# Patient Record
Sex: Male | Born: 1940 | Race: Black or African American | Hispanic: No | Marital: Married | Smoking: Never smoker
Health system: Southern US, Community
[De-identification: ages and names within clinical notes are randomized; demographics above are authoritative.]

## PROBLEM LIST (undated history)

## (undated) DIAGNOSIS — K219 Gastro-esophageal reflux disease without esophagitis: Secondary | ICD-10-CM

## (undated) DIAGNOSIS — M1990 Unspecified osteoarthritis, unspecified site: Secondary | ICD-10-CM

## (undated) DIAGNOSIS — E785 Hyperlipidemia, unspecified: Secondary | ICD-10-CM

## (undated) HISTORY — DX: Hyperlipidemia, unspecified: E78.5

## (undated) HISTORY — DX: Gastro-esophageal reflux disease without esophagitis: K21.9

---

## 2006-08-12 HISTORY — PX: GANGLION CYST EXCISION: SHX1691

## 2009-10-12 HISTORY — PX: COLONOSCOPY: SHX174

## 2015-03-05 ENCOUNTER — Ambulatory Visit
Admission: RE | Admit: 2015-03-05 | Discharge: 2015-03-05 | Disposition: A | Discharge status: Home / Self Care | Payer: Medicare Other | Source: Ambulatory Visit | Attending: Internal Medicine | Admitting: Internal Medicine

## 2015-03-05 ENCOUNTER — Other Ambulatory Visit: Payer: Self-pay | Admitting: Internal Medicine

## 2015-03-05 DIAGNOSIS — R059 Cough, unspecified: Secondary | ICD-10-CM

## 2015-03-05 DIAGNOSIS — R05 Cough: Secondary | ICD-10-CM

## 2015-03-06 ENCOUNTER — Encounter: Payer: Self-pay | Admitting: *Deleted

## 2015-03-18 ENCOUNTER — Encounter: Payer: Self-pay | Admitting: General Surgery

## 2015-03-18 ENCOUNTER — Ambulatory Visit (INDEPENDENT_AMBULATORY_CARE_PROVIDER_SITE_OTHER): Payer: Medicare Other | Admitting: General Surgery

## 2015-03-18 VITALS — BP 138/78 | HR 82 | Ht 68.0 in | Wt 188.0 lb

## 2015-03-18 DIAGNOSIS — K5900 Constipation, unspecified: Secondary | ICD-10-CM

## 2015-03-18 MED ORDER — POLYETHYLENE GLYCOL 3350 17 GM/SCOOP PO POWD: 1.0000 | Freq: Once | ORAL | Status: DC

## 2015-03-18 NOTE — Patient Instructions (Addendum)
Colonoscopy A colonoscopy is an exam to look at the entire large intestine (colon). This exam can help find problems such as tumors, polyps, inflammation, and areas of bleeding. The exam takes about 1 hour.  LET [facility]ALTH CARE PROVIDER KNOW ABOUT:   Any allergies you have.  All medicines you are taking, including vitamins, herbs, eye drops, creams, and over-the-counter medicines.  Previous problems you or members of your family have had with the use of anesthetics.  Any blood disorders you have.  Previous surgeries you have had.  Medical conditions you have. RISKS AND COMPLICATIONS  Generally, this is a safe procedure. However, as with any procedure, complications can occur. Possible complications include:  Bleeding.  Tearing or rupture of the colon wall.  Reaction to medicines given during the exam.  Infection (rare). BEFORE THE PROCEDURE   Ask your health care provider about changing or stopping your regular medicines.  You may be prescribed an oral bowel prep. This involves drinking a large amount of medicated liquid, starting the day before your procedure. The liquid will cause you to have multiple loose stools until your stool is almost clear or light green. This cleans out your colon in preparation for the procedure.  Do not eat or drink anything else once you have started the bowel prep, unless your health care provider tells you it is safe to do so.  Arrange for someone to drive you home after the procedure. PROCEDURE   You will be given medicine to help you relax (sedative).  You will lie on your side with your knees bent.  A long, flexible tube with a light and camera on the end (colonoscope) will be inserted through the rectum and into the colon. The camera sends video back to a computer screen as it moves through the colon. The colonoscope also releases carbon dioxide gas to inflate the colon. This helps your health care provider see the area better.  During  the exam, your health care provider may take a small tissue sample (biopsy) to be examined under a microscope if any abnormalities are found.  The exam is finished when the entire colon has been viewed. AFTER THE PROCEDURE   Do not drive for 24 hours after the exam.  You may have a small amount of blood in your stool.  You may pass moderate amounts of gas and have mild abdominal cramping or bloating. This is caused by the gas used to inflate your colon during the exam.  Ask when your test results will be ready and how you will get your results. Make sure you get your test results. Document Released: 09/25/2000 Document Revised: 07/19/2013 Document Reviewed: 06/05/2013 [facility] Patient Information 2015 [place]. This information is not intended to replace advice given to you by your health care provider. Make sure you discuss any questions you have with your health care provider.  The patient [facility]. He is aware to pre register with the hospital at least 2 days prior. He will take Dulcolax 10 mg 1 twice a day on 04/28/15, and 1 twice a day on 04/29/15, this is in addition to his regular bowel prep on 04/30/15. Miralax prescription has been sent into his pharmacy. The patient is aware of date and all instructions.

## 2015-03-18 NOTE — Progress Notes (Signed)
Patient ID: Carlos Morrison, male   DOB: 08/12/1941, 74 y.o.   MRN: [id number]  Chief Complaint  Patient presents with  . Other    screening colonoscopy    HPI Carlos Morrison is a 74 y.o. male here today for evaluation for colonoscopy. Patient reports problems with worsening constipation for the past year or more. He has changed his diet and used over the counter medications (miralax and metamucil which has not helped). He also increased his exercise but this has not helped with the constipation. He moves his bowels every 3 days or more. Drinks 6 glasses of water daily. Occasionally, sits on the commode waiting for a bowel movement (no preceding urge to defecate). If he strains vigorously at this time he will occasionally see a spot of bright blood. Denies nausea and vomiting. Reports the stools are hard like "balls"  when he does have bowel movements. His most  recent colonoscopy was in 2011 in [place], no polyps per patient.  Retired from the [employer] and moved back to [facility]. No family history of colon cancer. HPI  No past medical history on file.  Past Surgical History  Procedure Laterality Date  . Ganglion cyst excision Left 08/2006  . Colonoscopy  2011    Dr. Kizzie Ide    No family history on file.  Social History History  Substance Use Topics  . Smoking status: Never Smoker   . Smokeless tobacco: Never Used  . Alcohol Use: 0.0 oz/week    0 Standard drinks or equivalent per week     Comment: occasionally    No Known Allergies  Current Outpatient Prescriptions  Medication Sig Dispense Refill  . simvastatin (ZOCOR) 10 MG tablet Take 10 mg by mouth daily.    . polyethylene glycol powder (GLYCOLAX/MIRALAX) powder Take 255 g by mouth once. 255 g 0   No current facility-administered medications for this visit.    Review of Systems Review of Systems  Constitutional: Negative.   Respiratory: Positive for cough.   Cardiovascular: Negative.   Gastrointestinal: Positive for constipation.  Negative for nausea, diarrhea, blood in stool and rectal pain.    Blood pressure 138/78, pulse 82, height  (1.727 m), weight 188 lb (85.276 kg), SpO2 97 %.  Physical Exam Physical Exam  Constitutional: He is oriented to person, place, and time. He appears well-developed and well-nourished.  Cardiovascular: Normal rate, regular rhythm and normal heart sounds.   Pulmonary/Chest: Effort normal and breath sounds normal.  Abdominal: Soft. Bowel sounds are normal.  Genitourinary: Rectum normal and prostate normal. Rectal exam shows no external hemorrhoid, no fissure, no mass and anal tone normal. Guaiac negative stool.  Neurological: He is alert and oriented to person, place, and time.  Skin: Skin is warm and dry.    Data Reviewed  Hemoccult negative. Assessment    Change in bowel habits.    Plan    It's unlikely he is developed an obstructing malignancy over the past 5 years, as he is experienced no change in exercise tolerance, weight 70 abdominal bloating on days he does not have a bowel movement. Without any other clear for his symptoms, a repeat endoscopy is felt to be appropriate. Answering his report of scybala like stools, we'll plan for extra prep steps to ensure good cleansing of the colon.     Use miralax powder and laxative tablets to prepare for colonoscopy.  The patient will be scheduled for a colonoscopy.  Colonoscopy with possible biopsy/polypectomy prn: Information regarding the procedure,  including its potential risks and complications (including but not limited to perforation of the bowel, which may require emergency surgery to repair, and bleeding) was verbally given to the patient. Educational information regarding lower intestinal endoscopy was given to the patient. Written instructions for how to complete the bowel prep using Miralax were provided. The importance of drinking ample fluids to avoid dehydration as a result of the prep emphasized.  The patient is  [facility] He is aware to pre register with the hospital at least 2 days prior. He will take Dulcolax 10 mg 1 twice a day on 04/28/15, and 1 twice a day on 04/29/15, this is in addition to his regular bowel prep on 04/30/15. Miralax prescription has been sent into his pharmacy. The patient is aware of date and all instructions.   PCP: Darliss RidgelEason,Ernest  Carlos Morrison 03/19/2015, 8:25 AM

## 2015-03-19 DIAGNOSIS — K5900 Constipation, unspecified: Secondary | ICD-10-CM | POA: Insufficient documentation

## 2015-03-19 NOTE — H&P (Signed)
Patient ID: Carlos Morrison, male   DOB: 01/29/1941, 74 y.o.   MRN: [id number]  Chief Complaint  Patient presents with  . Other    screening colonoscopy    HPI [name] is a 74 y.o. male here today for evaluation for colonoscopy. Patient reports problems with worsening constipation for the past year or more. He has changed his diet and used over the counter medications (miralax and metamucil which has not helped). He also increased his exercise but this has not helped with the constipation. He moves his bowels every 3 days or more. Drinks 6 glasses of water daily. Occasionally, sits on the commode waiting for a bowel movement (no preceding urge to defecate). If he strains vigorously at this time he will occasionally see a spot of bright blood. Denies nausea and vomiting. Reports the stools are hard like "balls"  when he does have bowel movements. His most  recent colonoscopy was in 2011 in [place], no polyps per patient.  Retired from the [employer] and moved back to [place]. No family history of colon cancer. HPI  No past medical history on file.  Past Surgical History  Procedure Laterality Date  . Ganglion cyst excision Left 08/2006  . Colonoscopy  2011    Dr. Shih    No family history on file.  Social History History  Substance Use Topics  . Smoking status: Never Smoker   . Smokeless tobacco: Never Used  . Alcohol Use: 0.0 oz/week    0 Standard drinks or equivalent per week     Comment: occasionally    No Known Allergies  Current Outpatient Prescriptions  Medication Sig Dispense Refill  . simvastatin (ZOCOR) 10 MG tablet Take 10 mg by mouth daily.    . polyethylene glycol powder (GLYCOLAX/MIRALAX) powder Take 255 g by mouth once. 255 g 0   No current facility-administered medications for this visit.    Review of Systems Review of Systems  Constitutional: Negative.   Respiratory: Positive for cough.   Cardiovascular: Negative.   Gastrointestinal: Positive for constipation.  Negative for nausea, diarrhea, blood in stool and rectal pain.    Blood pressure 138/78, pulse 82, height 5' 8" (1.727 m), weight 188 lb (85.276 kg), SpO2 97 %.  Physical Exam Physical Exam  Constitutional: He is oriented to person, place, and time. He appears well-developed and well-nourished.  Cardiovascular: Normal rate, regular rhythm and normal heart sounds.   Pulmonary/Chest: Effort normal and breath sounds normal.  Abdominal: Soft. Bowel sounds are normal.  Genitourinary: Rectum normal and prostate normal. Rectal exam shows no external hemorrhoid, no fissure, no mass and anal tone normal. Guaiac negative stool.  Neurological: He is alert and oriented to person, place, and time.  Skin: Skin is warm and dry.    Data Reviewed  Hemoccult negative. Assessment    Change in bowel habits.    Plan    It's unlikely he is developed an obstructing malignancy over the past 5 years, as he is experienced no change in exercise tolerance, weight 70 abdominal bloating on days he does not have a bowel movement. Without any other clear for his symptoms, a repeat endoscopy is felt to be appropriate. Answering his report of scybala like stools, we'll plan for extra prep steps to ensure good cleansing of the colon.     Use miralax powder and laxative tablets to prepare for colonoscopy.  The patient will be scheduled for a colonoscopy.  Colonoscopy with possible biopsy/polypectomy prn: Information regarding the procedure,   including its potential risks and complications (including but not limited to perforation of the bowel, which may require emergency surgery to repair, and bleeding) was verbally given to the patient. Educational information regarding lower intestinal endoscopy was given to the patient. Written instructions for how to complete the bowel prep using Miralax were provided. The importance of drinking ample fluids to avoid dehydration as a result of the prep emphasized.  The patient is  scheduled for a colonoscopy at [facility] on 05/01/15. He is aware to pre register with the hospital at least 2 days prior. He will take Dulcolax 10 mg 1 twice a day on 04/28/15, and 1 twice a day on 04/29/15, this is in addition to his regular bowel prep on 04/30/15. Miralax prescription has been sent into his pharmacy. The patient is aware of date and all instructions.   PCP: Eason,Ernest  Brandee Markin W 03/19/2015, 8:25 AM    

## 2015-03-27 ENCOUNTER — Other Ambulatory Visit: Payer: Self-pay | Admitting: General Surgery

## 2015-04-12 ENCOUNTER — Telehealth: Payer: Self-pay | Admitting: *Deleted

## 2015-04-12 NOTE — Telephone Encounter (Signed)
Patient states he has already use his Polyethylene glycol power mix because he was having problems moving his bowel. He states he needs another prescription for his colonoscopy that is scheduled for May 01, 2015.

## 2015-04-12 NOTE — Telephone Encounter (Signed)
Please send new RX

## 2015-04-16 ENCOUNTER — Other Ambulatory Visit: Payer: Self-pay | Admitting: *Deleted

## 2015-04-16 DIAGNOSIS — Z1211 Encounter for screening for malignant neoplasm of colon: Secondary | ICD-10-CM

## 2015-04-16 MED ORDER — POLYETHYLENE GLYCOL 3350 17 GM/SCOOP PO POWD: 1.0000 | Freq: Once | ORAL | Status: DC

## 2015-04-16 NOTE — Telephone Encounter (Signed)
Send Rx . Notified patient .

## 2015-04-17 ENCOUNTER — Other Ambulatory Visit: Payer: Self-pay | Admitting: *Deleted

## 2015-04-17 DIAGNOSIS — Z1211 Encounter for screening for malignant neoplasm of colon: Secondary | ICD-10-CM

## 2015-04-17 MED ORDER — POLYETHYLENE GLYCOL 3350 17 GM/SCOOP PO POWD: ORAL | Status: DC

## 2015-04-17 NOTE — Progress Notes (Signed)
Miralax prescription resent into patient's pharmacy today.

## 2015-04-24 ENCOUNTER — Telehealth: Payer: Self-pay | Admitting: *Deleted

## 2015-04-24 NOTE — Telephone Encounter (Signed)
Patient confirms no medication changes since last office visit. Also, reports he has Miralax prescription.   [facility]. Patient instructed to call the office if he has further questions.

## 2015-04-30 ENCOUNTER — Encounter: Payer: Self-pay | Admitting: *Deleted

## 2015-05-01 ENCOUNTER — Ambulatory Visit: Payer: Medicare Other | Admitting: Anesthesiology

## 2015-05-01 ENCOUNTER — Encounter: Payer: Self-pay | Admitting: Anesthesiology

## 2015-05-01 ENCOUNTER — Ambulatory Visit
Admission: RE | Admit: 2015-05-01 | Discharge: 2015-05-01 | Disposition: A | Discharge status: Home / Self Care | Payer: Medicare Other | Source: Ambulatory Visit | Attending: General Surgery | Admitting: General Surgery

## 2015-05-01 ENCOUNTER — Encounter
Admission: RE | Disposition: A | Discharge status: Home / Self Care | Payer: Self-pay | Source: Ambulatory Visit | Attending: General Surgery

## 2015-05-01 DIAGNOSIS — Z1211 Encounter for screening for malignant neoplasm of colon: Secondary | ICD-10-CM | POA: Insufficient documentation

## 2015-05-01 DIAGNOSIS — Z79899 Other long term (current) drug therapy: Secondary | ICD-10-CM | POA: Insufficient documentation

## 2015-05-01 DIAGNOSIS — Z9889 Other specified postprocedural states: Secondary | ICD-10-CM | POA: Diagnosis not present

## 2015-05-01 DIAGNOSIS — K5900 Constipation, unspecified: Secondary | ICD-10-CM | POA: Diagnosis not present

## 2015-05-01 HISTORY — PX: COLONOSCOPY: SHX5424

## 2015-05-01 SURGERY — COLONOSCOPY
Anesthesia: General

## 2015-05-01 MED ORDER — LIDOCAINE HCL (PF) 1 % IJ SOLN
INTRAMUSCULAR | Status: AC
  Administered 2015-05-01: 0.3 mL
  Filled 2015-05-01: qty 2

## 2015-05-01 MED ORDER — MIDAZOLAM HCL 2 MG/2ML IJ SOLN
INTRAMUSCULAR | Status: DC | PRN
  Administered 2015-05-01: 1 mg via INTRAVENOUS

## 2015-05-01 MED ORDER — PROPOFOL INFUSION 10 MG/ML OPTIME
INTRAVENOUS | Status: DC | PRN
  Administered 2015-05-01: 120 ug/kg/min via INTRAVENOUS

## 2015-05-01 MED ORDER — SODIUM CHLORIDE 0.9 % IV SOLN
INTRAVENOUS | Status: DC
  Administered 2015-05-01: 1000 mL via INTRAVENOUS

## 2015-05-01 MED ORDER — FENTANYL CITRATE (PF) 100 MCG/2ML IJ SOLN
INTRAMUSCULAR | Status: DC | PRN
  Administered 2015-05-01: 50 ug via INTRAVENOUS

## 2015-05-01 MED ORDER — PHENYLEPHRINE HCL 10 MG/ML IJ SOLN
INTRAMUSCULAR | Status: DC | PRN
  Administered 2015-05-01: 50 ug via INTRAVENOUS

## 2015-05-01 MED ORDER — EPHEDRINE SULFATE 50 MG/ML IJ SOLN
INTRAMUSCULAR | Status: DC | PRN
  Administered 2015-05-01: 10 mg via INTRAVENOUS

## 2015-05-01 NOTE — Anesthesia Preprocedure Evaluation (Signed)
Anesthesia Evaluation  Patient identified by MRN, date of birth, ID band Patient awake    Reviewed: Allergy & Precautions, NPO status , Patient's Chart, lab work & pertinent test results, reviewed documented beta blocker date and time   Airway Mallampati: II  TM Distance: >3 FB     Dental  (+) Chipped   Pulmonary           Cardiovascular      Neuro/Psych    GI/Hepatic   Endo/Other    Renal/GU      Musculoskeletal   Abdominal   Peds  Hematology   Anesthesia Other Findings   Reproductive/Obstetrics                             Anesthesia Physical Anesthesia Plan  ASA: III  Anesthesia Plan: General   Post-op Pain Management:    Induction: Intravenous  Airway Management Planned: Nasal Cannula  Additional Equipment:   Intra-op Plan:   Post-operative Plan:   Informed Consent: I have reviewed the patients History and Physical, chart, labs and discussed the procedure including the risks, benefits and alternatives for the proposed anesthesia with the patient or authorized representative who has indicated his/her understanding and acceptance.     Plan Discussed with: CRNA  Anesthesia Plan Comments:         Anesthesia Quick Evaluation  

## 2015-05-01 NOTE — Transfer of Care (Signed)
Immediate Anesthesia Transfer of Care Note  Patient: Carlos Morrison  Procedure(s) Performed: Procedure(s): COLONOSCOPY (N/A)  Patient Location: PACU  Anesthesia Type:General  Level of Consciousness: awake and sedated  Airway & Oxygen Therapy: Patient Spontanous Breathing and Patient connected to nasal cannula oxygen  Post-op Assessment: Report given to RN and Post -op Vital signs reviewed and stable  Post vital signs: Reviewed stable  Last Vitals:  Filed Vitals:   05/01/15 1034  BP: 121/73  Pulse: 94  Temp: 35.6 C  Resp: 17    Complications: No apparent anesthesia complications

## 2015-05-01 NOTE — Anesthesia Postprocedure Evaluation (Signed)
  Anesthesia Post-op Note  Patient: Adaline SillWilliam H Nickolson  Procedure(s) Performed: Procedure(s): COLONOSCOPY (N/A)  Anesthesia type:General  Patient location: PACU  Post pain: Pain level controlled  Post assessment: Post-op Vital signs reviewed, Patient's Cardiovascular Status Stable, Respiratory Function Stable, Patent Airway and No signs of Nausea or vomiting  Post vital signs: Reviewed and stable  Last Vitals:  Filed Vitals:   05/01/15 1210  BP: 135/82  Pulse: 90  Temp:   Resp: 26    Level of consciousness: awake, alert  and patient cooperative  Complications: No apparent anesthesia complications

## 2015-05-01 NOTE — Op Note (Signed)
[facility] Gastroenterology Patient Name: Carlos Morrison Procedure Date: 05/01/2015 11:01 AM MRN: [id number] Account #: [id number] Date of Birth: [birth year] Admit Type: Outpatient Age: 74 Room: [facility] ENDO ROOM 1 Gender: Male Note Status: Finalized Procedure:         Colonoscopy Indications:       Screening for colorectal malignant neoplasm Providers:         Earline Mayotte, MD Referring MD:      Serita Sheller. Maryellen Pile, MD (Referring MD) Medicines:         Monitored Anesthesia Care Complications:     No immediate complications. Procedure:         Pre-Anesthesia Assessment:                    - Prior to the procedure, a History and Physical was                     performed, and patient medications, allergies and                     sensitivities were reviewed. The patient's tolerance of                     previous anesthesia was reviewed.                    - The risks and benefits of the procedure and the sedation                     options and risks were discussed with the patient. All                     questions were answered and informed consent was obtained.                    After obtaining informed consent, the colonoscope was                     passed under direct vision. Throughout the procedure, the                     patient's blood pressure, pulse, and oxygen saturations                     were monitored continuously. The Colonoscope was                     introduced through the anus and advanced to the the cecum,                     identified by the appendiceal orifice, ileocecal valve and                     palpation. The colonoscopy was somewhat difficult due to                     significant looping. Successful completion of the                     procedure was aided by changing the patient to a supine                     position. The patient tolerated the procedure well. The  quality of the bowel preparation was  excellent. Findings:      The entire examined colon appeared normal on direct and retroflexion       views. Impression:        - The entire examined colon is normal on direct and                     retroflexion views.                    - No specimens collected. Recommendation:    - Repeat colonoscopy in 10 years for screening purposes. Diagnosis Code(s): --- Professional ---                    Z12.11, Encounter for screening for malignant neoplasm of                     colon Earline MayotteJeffrey W. Jennalee Greaves, MD 05/01/2015 11:37:15 AM This report has been signed electronically. Number of Addenda: 0 Note Initiated On: 05/01/2015 11:01 AM Scope Withdrawal Time: 0 hours 4 minutes 49 seconds  Total Procedure Duration: 0 hours 30 minutes 0 seconds       [facility]

## 2015-05-01 NOTE — H&P (Signed)
Patient ID: Carlos Morrison, male DOB: 19-Feb-1941, 74 y.o. MRN: [id number]  Chief Complaint   Patient presents with   .  Other     screening colonoscopy    HPI  Carlos Morrison is a 74 y.o. male here today for evaluation for colonoscopy. Patient reports problems with worsening constipation for the past year or more. He has changed his diet and used over the counter medications (miralax and metamucil which has not helped). He also increased his exercise but this has not helped with the constipation. He moves his bowels every 3 days or more. Drinks 6 glasses of water daily. Occasionally, sits on the commode waiting for a bowel movement (no preceding urge to defecate). If he strains vigorously at this time he will occasionally see a spot of bright blood. Denies nausea and vomiting. Reports the stools are hard like "balls" when he does have bowel movements. His most recent colonoscopy was in 2011 in [place], no polyps per patient. Retired from the [employer] and moved back to [employer]. No family history of colon cancer.  HPI  No past medical history on file.  Past Surgical History   Procedure  Laterality  Date   .  Ganglion cyst excision  Left  08/2006   .  Colonoscopy   2011     Dr. Kizzie Ide    No family history on file.  Social History  History   Substance Use Topics   .  Smoking status:  Never Smoker   .  Smokeless tobacco:  Never Used   .  Alcohol Use:  0.0 oz/week     0 Standard drinks or equivalent per week      Comment: occasionally    No Known Allergies  Current Outpatient Prescriptions   Medication  Sig  Dispense  Refill   .  simvastatin (ZOCOR) 10 MG tablet  Take 10 mg by mouth daily.     .  polyethylene glycol powder (GLYCOLAX/MIRALAX) powder  Take 255 g by mouth once.  255 g  0    No current facility-administered medications for this visit.    Review of Systems  Review of Systems  Constitutional: Negative.  Respiratory:  Positive for cough.  Cardiovascular: Negative.  Gastrointestinal: Positive for constipation. Negative for nausea, diarrhea, blood in stool and rectal pain.   Blood pressure 138/78, pulse 82, height  (1.727 m), weight 188 lb (85.276 kg), SpO2 97 %.  Physical Exam  Physical Exam  Constitutional: He is oriented to person, place, and time. He appears well-developed and well-nourished.  Cardiovascular: Normal rate, regular rhythm and normal heart sounds.  Pulmonary/Chest: Effort normal and breath sounds normal.  Abdominal: Soft. Bowel sounds are normal.  Genitourinary: Rectum normal and prostate normal. Rectal exam shows no external hemorrhoid, no fissure, no mass and anal tone normal. Guaiac negative stool.  Neurological: He is alert and oriented to person, place, and time.  Skin: Skin is warm and dry.   Data Reviewed  Hemoccult negative.  Assessment   Change in bowel habits.   Plan   It's unlikely he is developed an obstructing malignancy over the past 5 years, as he is experienced no change in exercise tolerance, weight 70 abdominal bloating on days he does not have a bowel movement. Without any other clear for his symptoms, a repeat endoscopy is felt to be appropriate. Answering his report of scybala like stools, we'll plan for extra prep steps to ensure good cleansing of the colon.   Use miralax  powder and laxative tablets to prepare for colonoscopy.  The patient will be scheduled for a colonoscopy.  Colonoscopy with possible biopsy/polypectomy prn: Information regarding the procedure, including its potential risks and complications (including but not limited to perforation of the bowel, which may require emergency surgery to repair, and bleeding) was verbally given to the patient. Educational information regarding lower intestinal endoscopy was given to the patient. Written instructions for how to complete the bowel prep using Miralax were provided. The importance  of drinking ample fluids to avoid dehydration as a result of the prep emphasized.  The patient is [facility]. He is aware to pre register with the hospital at least 2 days prior. He will take Dulcolax 10 mg 1 twice a day on 04/28/15, and 1 twice a day on 04/29/15, this is in addition to his regular bowel prep on 04/30/15. Miralax prescription has been sent into his pharmacy. The patient is aware of date and all instructions.  PCP: Darliss RidgelEason,Ernest  Rahmel Nedved W  03/19/2015, 8:25 AM   No change in clinical history or exam. Lungs: Clear. Cardio: RR. HEENT: Neg. Tolerated prep well.

## 2015-05-01 NOTE — Anesthesia Procedure Notes (Signed)
Performed by: COOK-MARTIN, Deshana Rominger Pre-anesthesia Checklist: Patient identified, Emergency Drugs available, Suction available, Patient being monitored and Timeout performed Patient Re-evaluated:Patient Re-evaluated prior to inductionOxygen Delivery Method: Nasal cannula Preoxygenation: Pre-oxygenation with 100% oxygen Intubation Type: IV induction Placement Confirmation: CO2 detector and positive ETCO2       

## 2015-05-06 ENCOUNTER — Encounter: Payer: Self-pay | Admitting: General Surgery

## 2016-08-20 ENCOUNTER — Other Ambulatory Visit: Payer: Self-pay | Admitting: Internal Medicine

## 2016-08-20 ENCOUNTER — Ambulatory Visit
Admission: RE | Admit: 2016-08-20 | Discharge: 2016-08-20 | Disposition: A | Discharge status: Home / Self Care | Payer: Medicare Other | Source: Ambulatory Visit | Attending: Internal Medicine | Admitting: Internal Medicine

## 2016-08-20 DIAGNOSIS — Z1389 Encounter for screening for other disorder: Secondary | ICD-10-CM | POA: Diagnosis present

## 2016-08-20 DIAGNOSIS — Z111 Encounter for screening for respiratory tuberculosis: Secondary | ICD-10-CM

## 2017-09-16 ENCOUNTER — Ambulatory Visit (INDEPENDENT_AMBULATORY_CARE_PROVIDER_SITE_OTHER): Payer: Medicare Other | Admitting: Urology

## 2017-09-16 ENCOUNTER — Encounter: Payer: Self-pay | Admitting: Urology

## 2017-09-16 VITALS — BP 139/82 | HR 92 | Ht 68.0 in | Wt 174.0 lb

## 2017-09-16 DIAGNOSIS — R3129 Other microscopic hematuria: Secondary | ICD-10-CM | POA: Diagnosis not present

## 2017-09-16 LAB — URINALYSIS, COMPLETE
BILIRUBIN UA: NEGATIVE
GLUCOSE, UA: NEGATIVE
Ketones, UA: NEGATIVE
Leukocytes, UA: NEGATIVE
Nitrite, UA: NEGATIVE
PH UA: 7 (ref 5.0–7.5)
Protein, UA: NEGATIVE
RBC UA: NEGATIVE
Specific Gravity, UA: 1.015 (ref 1.005–1.030)
UUROB: 0.2 mg/dL (ref 0.2–1.0)

## 2017-09-16 LAB — MICROSCOPIC EXAMINATION
Epithelial Cells (non renal): NONE SEEN /hpf (ref 0–10)
WBC, UA: NONE SEEN /hpf (ref 0–?)

## 2017-09-16 NOTE — Progress Notes (Signed)
09/16/2017 11:43 AM   Carlos Morrison [birth year] [id number]  Referring provider: Derwood KaplanEason, Ernest B, MD [address] [place], [place] [address]  Chief Complaint  Patient presents with  . Hematuria    HPI: The patient is a 76 year old gentleman who presents today to discuss his microscopic hematuria.  He was seen on a routine screening urinalysis.  It is also present on a urinalysis performed in the office today.  He denies any history of gross hematuria.  Denies history of nephrolithiasis.  He has no history of urinary tract infection.  He voids well without difficulty.  He has no genitourinary complaints at this time.   PMH: Past Medical History:  Diagnosis Date  . GERD (gastroesophageal reflux disease)   . Hyperlipidemia     Surgical History: Past Surgical History:  Procedure Laterality Date  . COLONOSCOPY  2011   Dr. Kizzie IdeShih  . COLONOSCOPY N/A 05/01/2015   Procedure: COLONOSCOPY;  Surgeon: Earline MayotteJeffrey W Byrnett, MD;  [facility];  Service: Endoscopy;  Laterality: N/A;  . GANGLION CYST EXCISION Left 08/2006    Home Medications:  Allergies as of 09/16/2017   No Known Allergies     Medication List        Accurate as of 09/16/17 11:43 AM. Always use your most recent med list.          LINZESS 72 MCG capsule Generic drug:  linaclotide Take by mouth.   omeprazole 40 MG capsule Commonly known as:  PRILOSEC TAKE ONE PILL ONCE DAILY, 30 MINUTES PRIOR TO EVENING MEAL.   simvastatin 10 MG tablet Commonly known as:  ZOCOR Take 10 mg by mouth daily.       Allergies: No Known Allergies  Family History: Family History  Problem Relation Age of Onset  . Prostate cancer Neg Hx   . Bladder Cancer Neg Hx   . Kidney cancer Neg Hx     Social History:  reports that  has never smoked. he has never used smokeless tobacco. He reports that he drinks alcohol. He reports that he does not use drugs.  ROS: UROLOGY Frequent Urination?: No Hard to postpone urination?:  No Burning/pain with urination?: No Get up at night to urinate?: Yes Leakage of urine?: No Urine stream starts and stops?: No Trouble starting stream?: No Do you have to strain to urinate?: No Blood in urine?: Yes Urinary tract infection?: No Sexually transmitted disease?: No Injury to kidneys or bladder?: No Painful intercourse?: No Weak stream?: No Erection problems?: Yes Penile pain?: No  Gastrointestinal Nausea?: No Vomiting?: No Indigestion/heartburn?: No Diarrhea?: No Constipation?: Yes  Constitutional Fever: No Night sweats?: No Weight loss?: No Fatigue?: Yes  Skin Skin rash/lesions?: No Itching?: No  Eyes Blurred vision?: No Double vision?: No  Ears/Nose/Throat Sore throat?: No Sinus problems?: No  Hematologic/Lymphatic Swollen glands?: No Easy bruising?: No  Cardiovascular Leg swelling?: No Chest pain?: No  Respiratory Cough?: No Shortness of breath?: No  Endocrine Excessive thirst?: No  Musculoskeletal Back pain?: No Joint pain?: No  Neurological Headaches?: No Dizziness?: No  Psychologic Depression?: No Anxiety?: No  Physical Exam: BP 139/82 (BP Location: Right Arm, Patient Position: Sitting, Cuff Size: Normal)   Pulse 92   Ht 5\' 8"  (1.727 m)   Wt 174 lb (78.9 kg)   BMI 26.46 kg/m   Constitutional:  Alert and oriented, No acute distress. HEENT: [name], moist mucus membranes.  Trachea midline, no masses. Cardiovascular: No clubbing, cyanosis, or edema. Respiratory: Normal respiratory effort, no increased  work of breathing. GI: Abdomen is soft, nontender, nondistended, no abdominal masses GU: No CVA tenderness.  Skin: No rashes, bruises or suspicious lesions. Lymph: No cervical or inguinal adenopathy. Neurologic: Grossly intact, no focal deficits, moving all 4 extremities. Psychiatric: Normal mood and affect.  Laboratory Data: No results found for: WBC, HGB, HCT, MCV, PLT  No results found for: CREATININE  No results  found for: PSA  No results found for: TESTOSTERONE  No results found for: HGBA1C  Urinalysis No results found for: COLORURINE, APPEARANCEUR, LABSPEC, PHURINE, GLUCOSEU, HGBUR, BILIRUBINUR, KETONESUR, PROTEINUR, UROBILINOGEN, NITRITE, LEUKOCYTESUR   Assessment & Plan:    1. Microscopic hematuria We will plan for CT urogram followed by office cystoscopy   Return for after CT for cysto.  Hildred LaserBrian James Meika Earll, MD  [facility] [address], [address] [place], [place] [address] [phone]

## 2017-10-14 ENCOUNTER — Other Ambulatory Visit: Payer: Medicare Other

## 2017-10-21 ENCOUNTER — Ambulatory Visit
Admission: RE | Admit: 2017-10-21 | Discharge: 2017-10-21 | Disposition: A | Discharge status: Home / Self Care | Payer: Medicare Other | Source: Ambulatory Visit | Attending: Urology | Admitting: Urology

## 2017-10-21 DIAGNOSIS — N400 Enlarged prostate without lower urinary tract symptoms: Secondary | ICD-10-CM | POA: Diagnosis not present

## 2017-10-21 DIAGNOSIS — R3129 Other microscopic hematuria: Secondary | ICD-10-CM | POA: Insufficient documentation

## 2017-10-21 LAB — POCT I-STAT CREATININE: Creatinine, Ser: 0.9 mg/dL (ref 0.61–1.24)

## 2017-10-21 MED ORDER — IOPAMIDOL (ISOVUE-300) INJECTION 61%
125.0000 mL | Freq: Once | INTRAVENOUS | Status: AC | PRN
  Administered 2017-10-21: 125 mL via INTRAVENOUS

## 2017-10-28 ENCOUNTER — Ambulatory Visit (INDEPENDENT_AMBULATORY_CARE_PROVIDER_SITE_OTHER): Payer: Medicare Other | Admitting: Urology

## 2017-10-28 ENCOUNTER — Encounter: Payer: Self-pay | Admitting: Urology

## 2017-10-28 VITALS — BP 168/92 | HR 92 | Ht 68.0 in | Wt 176.5 lb

## 2017-10-28 DIAGNOSIS — R3129 Other microscopic hematuria: Secondary | ICD-10-CM | POA: Diagnosis not present

## 2017-10-28 LAB — URINALYSIS, COMPLETE
Bilirubin, UA: NEGATIVE
Glucose, UA: NEGATIVE
Ketones, UA: NEGATIVE
Leukocytes, UA: NEGATIVE
Nitrite, UA: NEGATIVE
Protein, UA: NEGATIVE
Specific Gravity, UA: 1.015 (ref 1.005–1.030)
UUROB: 0.2 mg/dL (ref 0.2–1.0)
pH, UA: 7 (ref 5.0–7.5)

## 2017-10-28 LAB — MICROSCOPIC EXAMINATION
BACTERIA UA: NONE SEEN
Epithelial Cells (non renal): NONE SEEN /hpf (ref 0–10)
WBC UA: NONE SEEN /HPF (ref 0–?)

## 2017-10-28 MED ORDER — CIPROFLOXACIN HCL 500 MG PO TABS
500.0000 mg | ORAL_TABLET | Freq: Once | ORAL | Status: AC
  Administered 2017-10-28: 500 mg via ORAL

## 2017-10-28 MED ORDER — LIDOCAINE HCL 2 % EX GEL
1.0000 "application " | Freq: Once | CUTANEOUS | Status: AC
  Administered 2017-10-28: 1 via URETHRAL

## 2017-10-28 NOTE — Progress Notes (Signed)
   10/28/17  CC: No chief complaint on file.   HPI: The patient is a 77 year old gentleman who presents today for follow up of his microscopic hematuria.  It was seen on a routine screening urinalysis.  It was also present on a urinalysis performed at last visit in this office.  He denies any history of gross hematuria.  Denies history of nephrolithiasis.  He has no history of urinary tract infection.  He voids well without difficulty.  He has no genitourinary complaints at this time.  CT urogram was negative for source of microscopic hematuria.  There were no vitals taken for this visit. NED. A&Ox3.   No respiratory distress   Abd soft, NT, ND Normal phallus with bilateral descended testicles  Cystoscopy Procedure Note  Patient identification was confirmed, informed consent was obtained, and patient was prepped using Betadine solution.  Lidocaine jelly was administered per urethral meatus.    Preoperative abx where received prior to procedure.     Pre-Procedure: - Inspection reveals a normal caliber ureteral meatus.  Procedure: The flexible cystoscope was introduced without difficulty - No urethral strictures/lesions are present. - Enlarged prostate visually obstructive.  1-2 mm calcification freed from prostate urethra and knocked in the bladder.  No other stones noted within the urethra or - Normal bladder neck - Bilateral ureteral orifices identified - Bladder mucosa  reveals no ulcers, tumors, or lesions - No bladder stones - No trabeculation  Retroflexion shows no intravesical lobe   Post-Procedure: - Patient tolerated the procedure well  Assessment/ Plan:  1. Microscopic hematuria Negative work up. Follow up in one year for repeat urinalysis

## 2018-03-25 NOTE — Discharge Instructions (Signed)

## 2018-03-28 ENCOUNTER — Encounter: Payer: Self-pay | Admitting: *Deleted

## 2018-03-30 ENCOUNTER — Encounter: Payer: Self-pay | Admitting: *Deleted

## 2018-03-30 ENCOUNTER — Encounter
Admission: RE | Disposition: A | Discharge status: Home / Self Care | Payer: Self-pay | Source: Ambulatory Visit | Attending: Ophthalmology

## 2018-03-30 ENCOUNTER — Ambulatory Visit: Payer: Medicare Other | Admitting: Anesthesiology

## 2018-03-30 ENCOUNTER — Ambulatory Visit
Admission: RE | Admit: 2018-03-30 | Discharge: 2018-03-30 | Disposition: A | Discharge status: Home / Self Care | Payer: Medicare Other | Source: Ambulatory Visit | Attending: Ophthalmology | Admitting: Ophthalmology

## 2018-03-30 DIAGNOSIS — H2512 Age-related nuclear cataract, left eye: Secondary | ICD-10-CM | POA: Diagnosis not present

## 2018-03-30 DIAGNOSIS — Z79899 Other long term (current) drug therapy: Secondary | ICD-10-CM | POA: Diagnosis not present

## 2018-03-30 DIAGNOSIS — E7800 Pure hypercholesterolemia, unspecified: Secondary | ICD-10-CM | POA: Diagnosis not present

## 2018-03-30 HISTORY — PX: CATARACT EXTRACTION W/PHACO: SHX586

## 2018-03-30 SURGERY — PHACOEMULSIFICATION, CATARACT, WITH IOL INSERTION
Anesthesia: Monitor Anesthesia Care | Laterality: Left | Wound class: "Clean "

## 2018-03-30 MED ORDER — EPINEPHRINE PF 1 MG/ML IJ SOLN
INTRAOCULAR | Status: DC | PRN
  Administered 2018-03-30: 67 mL via OPHTHALMIC

## 2018-03-30 MED ORDER — NA HYALUR & NA CHOND-NA HYALUR 0.4-0.35 ML IO KIT
PACK | INTRAOCULAR | Status: DC | PRN
  Administered 2018-03-30: 1 mL via INTRAOCULAR

## 2018-03-30 MED ORDER — ACETAMINOPHEN 325 MG PO TABS: 325.0000 mg | ORAL_TABLET | ORAL | Status: DC | PRN

## 2018-03-30 MED ORDER — CEFUROXIME OPHTHALMIC INJECTION 1 MG/0.1 ML
INJECTION | OPHTHALMIC | Status: DC | PRN
  Administered 2018-03-30: 0.1 mL via INTRACAMERAL

## 2018-03-30 MED ORDER — ARMC OPHTHALMIC DILATING DROPS
1.0000 "application " | OPHTHALMIC | Status: DC | PRN
  Administered 2018-03-30 (×3): 1 via OPHTHALMIC

## 2018-03-30 MED ORDER — MOXIFLOXACIN HCL 0.5 % OP SOLN
1.0000 [drp] | OPHTHALMIC | Status: DC | PRN
  Administered 2018-03-30 (×3): 1 [drp] via OPHTHALMIC

## 2018-03-30 MED ORDER — FENTANYL CITRATE (PF) 100 MCG/2ML IJ SOLN
INTRAMUSCULAR | Status: DC | PRN
  Administered 2018-03-30: 50 ug via INTRAVENOUS

## 2018-03-30 MED ORDER — ACETAMINOPHEN 160 MG/5ML PO SOLN: 325.0000 mg | ORAL | Status: DC | PRN

## 2018-03-30 MED ORDER — BALANCED SALT IO SOLN
INTRAOCULAR | Status: DC | PRN
  Administered 2018-03-30: 1 mL via INTRAMUSCULAR

## 2018-03-30 MED ORDER — BRIMONIDINE TARTRATE-TIMOLOL 0.2-0.5 % OP SOLN
OPHTHALMIC | Status: DC | PRN
  Administered 2018-03-30: 1 [drp] via OPHTHALMIC

## 2018-03-30 MED ORDER — MIDAZOLAM HCL 2 MG/2ML IJ SOLN
INTRAMUSCULAR | Status: DC | PRN
  Administered 2018-03-30: 1 mg via INTRAVENOUS

## 2018-03-30 MED ORDER — LACTATED RINGERS IV SOLN: INTRAVENOUS | Status: DC

## 2018-03-30 SURGICAL SUPPLY — 27 items
CANNULA ANT/CHMB 27G (MISCELLANEOUS) ×1 IMPLANT
CANNULA ANT/CHMB 27GA (MISCELLANEOUS) ×3 IMPLANT
CARTRIDGE ABBOTT (MISCELLANEOUS) IMPLANT
GLOVE SURG LX 7.5 STRW (GLOVE) ×2
GLOVE SURG LX STRL 7.5 STRW (GLOVE) ×1 IMPLANT
GLOVE SURG TRIUMPH 8.0 PF LTX (GLOVE) ×3 IMPLANT
GOWN STRL REUS W/ TWL LRG LVL3 (GOWN DISPOSABLE) ×2 IMPLANT
GOWN STRL REUS W/TWL LRG LVL3 (GOWN DISPOSABLE) ×4
LENS IOL TECNIS ITEC 18.5 (Intraocular Lens) ×2 IMPLANT
MARKER SKIN DUAL TIP RULER LAB (MISCELLANEOUS) ×3 IMPLANT
NDL FILTER BLUNT 18X1 1/2 (NEEDLE) ×1 IMPLANT
NDL RETROBULBAR .5 NSTRL (NEEDLE) IMPLANT
NEEDLE FILTER BLUNT 18X 1/2SAF (NEEDLE) ×2
NEEDLE FILTER BLUNT 18X1 1/2 (NEEDLE) ×1 IMPLANT
PACK CATARACT BRASINGTON (MISCELLANEOUS) ×3 IMPLANT
PACK EYE AFTER SURG (MISCELLANEOUS) ×3 IMPLANT
PACK OPTHALMIC (MISCELLANEOUS) ×3 IMPLANT
RING MALYGIN 7.0 (MISCELLANEOUS) IMPLANT
SUT ETHILON 10-0 CS-B-6CS-B-6 (SUTURE)
SUT VICRYL  9 0 (SUTURE)
SUT VICRYL 9 0 (SUTURE) IMPLANT
SUTURE EHLN 10-0 CS-B-6CS-B-6 (SUTURE) IMPLANT
SYR 3ML LL SCALE MARK (SYRINGE) ×3 IMPLANT
SYR 5ML LL (SYRINGE) ×3 IMPLANT
SYR TB 1ML LUER SLIP (SYRINGE) ×3 IMPLANT
WATER STERILE IRR 500ML POUR (IV SOLUTION) ×3 IMPLANT
WIPE NON LINTING 3.25X3.25 (MISCELLANEOUS) ×3 IMPLANT

## 2018-03-30 NOTE — H&P (Signed)
The History and Physical notes are on paper, have been signed, and are to be scanned. The patient remains stable and unchanged from the H&P.   Previous H&P reviewed, patient examined, and there are no changes.  Carlos Morrison 03/30/2018 9:13 AM

## 2018-03-30 NOTE — Anesthesia Postprocedure Evaluation (Signed)
Anesthesia Post Note  Patient: Carlos Morrison  Procedure(s) Performed: CATARACT EXTRACTION PHACO AND INTRAOCULAR LENS PLACEMENT (IOC)  LEFT (Left )  Patient location during evaluation: PACU Anesthesia Type: MAC Level of consciousness: awake and alert Pain management: pain level controlled Vital Signs Assessment: post-procedure vital signs reviewed and stable Respiratory status: spontaneous breathing, nonlabored ventilation, respiratory function stable and patient connected to nasal cannula oxygen Cardiovascular status: stable and blood pressure returned to baseline Postop Assessment: no apparent nausea or vomiting Anesthetic complications: no    Trecia Rogers

## 2018-03-30 NOTE — Anesthesia Procedure Notes (Signed)
Procedure Name: MAC Performed by: Korvin Valentine, CRNA Pre-anesthesia Checklist: Patient identified, Emergency Drugs available, Suction available, Timeout performed and Patient being monitored Patient Re-evaluated:Patient Re-evaluated prior to induction Oxygen Delivery Method: Nasal cannula Placement Confirmation: positive ETCO2       

## 2018-03-30 NOTE — Anesthesia Preprocedure Evaluation (Signed)
Anesthesia Evaluation  Patient identified by MRN, date of birth, ID band Patient awake    Reviewed: Allergy & Precautions, H&P , NPO status , Patient's Chart, lab work & pertinent test results, reviewed documented beta blocker date and time   Airway Mallampati: II  TM Distance: >3 FB Neck ROM: full    Dental no notable dental hx.    Pulmonary neg pulmonary ROS,    Pulmonary exam normal breath sounds clear to auscultation       Cardiovascular Exercise Tolerance: Good negative cardio ROS Normal cardiovascular exam Rhythm:regular Rate:Normal     Neuro/Psych negative neurological ROS  negative psych ROS   GI/Hepatic Neg liver ROS, GERD  ,  Endo/Other  negative endocrine ROS  Renal/GU negative Renal ROS  negative genitourinary   Musculoskeletal   Abdominal   Peds  Hematology negative hematology ROS (+)   Anesthesia Other Findings   Reproductive/Obstetrics negative OB ROS                             Anesthesia Physical Anesthesia Plan  ASA: II  Anesthesia Plan: General and MAC   Post-op Pain Management:    Induction:   PONV Risk Score and Plan:   Airway Management Planned:   Additional Equipment:   Intra-op Plan:   Post-operative Plan:   Informed Consent: I have reviewed the patients History and Physical, chart, labs and discussed the procedure including the risks, benefits and alternatives for the proposed anesthesia with the patient or authorized representative who has indicated his/her understanding and acceptance.   Dental Advisory Given  Plan Discussed with: CRNA and Anesthesiologist  Anesthesia Plan Comments:         Anesthesia Quick Evaluation

## 2018-03-30 NOTE — Op Note (Signed)
OPERATIVE NOTE  Carlos SillWilliam H Pewitt [id number] 03/30/2018   PREOPERATIVE DIAGNOSIS:  Nuclear sclerotic cataract left eye. H25.12   POSTOPERATIVE DIAGNOSIS:    Nuclear sclerotic cataract left eye.     PROCEDURE:  Phacoemusification with posterior chamber intraocular lens placement of the left eye   LENS:   Implant Name Type Inv. Item Serial No. Manufacturer Lot No. LRB No. Used  LENS IOL DIOP 18.5 - [id number] Intraocular Lens LENS IOL DIOP 18.5 [id number]  Left 1        ULTRASOUND TIME: 13  % of 0 minutes 51 seconds, CDE 6.8  SURGEON:  Deirdre Evenerhadwick R. Tiana Sivertson, MD   ANESTHESIA:  Topical with tetracaine drops and 2% Xylocaine jelly, augmented with 1% preservative-free intracameral lidocaine.    COMPLICATIONS:  None.   DESCRIPTION OF PROCEDURE:  The patient was identified in the holding room and transported to the operating room and placed in the supine position under the operating microscope.  The left eye was identified as the operative eye and it was prepped and draped in the usual sterile ophthalmic fashion.   A 1 millimeter clear-corneal paracentesis was made at the 1:30 position.  0.5 ml of preservative-free 1% lidocaine was injected into the anterior chamber.  The anterior chamber was filled with Viscoat viscoelastic.  A 2.4 millimeter keratome was used to make a near-clear corneal incision at the 10:30 position.  .  A curvilinear capsulorrhexis was made with a cystotome and capsulorrhexis forceps.  Balanced salt solution was used to hydrodissect and hydrodelineate the nucleus.   Phacoemulsification was then used in stop and chop fashion to remove the lens nucleus and epinucleus.  The remaining cortex was then removed using the irrigation and aspiration handpiece. Provisc was then placed into the capsular bag to distend it for lens placement.  A lens was then injected into the capsular bag.  The remaining viscoelastic was aspirated.   Wounds were hydrated with balanced salt  solution.  The anterior chamber was inflated to a physiologic pressure with balanced salt solution.  No wound leaks were noted. Cefuroxime 0.1 ml of a 10mg /ml solution was injected into the anterior chamber for a dose of 1 mg of intracameral antibiotic at the completion of the case.   Timolol and Brimonidine drops were applied to the eye.  The patient was taken to the recovery room in stable condition without complications of anesthesia or surgery.  [name] 03/30/2018, 10:00 AM

## 2018-03-30 NOTE — Transfer of Care (Signed)
Immediate Anesthesia Transfer of Care Note  Patient: Carlos Morrison  Procedure(s) Performed: CATARACT EXTRACTION PHACO AND INTRAOCULAR LENS PLACEMENT (IOC)  LEFT (Left )  Patient Location: PACU  Anesthesia Type: General, MAC  Level of Consciousness: awake, alert  and patient cooperative  Airway and Oxygen Therapy: Patient Spontanous Breathing and Patient connected to supplemental oxygen  Post-op Assessment: Post-op Vital signs reviewed, Patient's Cardiovascular Status Stable, Respiratory Function Stable, Patent Airway and No signs of Nausea or vomiting  Post-op Vital Signs: Reviewed and stable  Complications: No apparent anesthesia complications

## 2018-03-31 ENCOUNTER — Encounter: Payer: Self-pay | Admitting: Ophthalmology

## 2018-10-28 ENCOUNTER — Ambulatory Visit: Payer: Medicare Other | Admitting: Urology

## 2019-09-06 ENCOUNTER — Other Ambulatory Visit: Payer: Self-pay | Admitting: Internal Medicine

## 2019-09-06 DIAGNOSIS — R634 Abnormal weight loss: Secondary | ICD-10-CM

## 2019-09-06 DIAGNOSIS — K5900 Constipation, unspecified: Secondary | ICD-10-CM

## 2019-09-06 DIAGNOSIS — R5381 Other malaise: Secondary | ICD-10-CM

## 2019-09-06 DIAGNOSIS — R5383 Other fatigue: Secondary | ICD-10-CM

## 2019-09-20 ENCOUNTER — Ambulatory Visit
Admission: RE | Admit: 2019-09-20 | Discharge: 2019-09-20 | Disposition: A | Discharge status: Home / Self Care | Payer: Medicare Other | Source: Ambulatory Visit | Attending: Internal Medicine | Admitting: Internal Medicine

## 2019-09-20 ENCOUNTER — Other Ambulatory Visit: Payer: Self-pay

## 2019-09-20 DIAGNOSIS — K5900 Constipation, unspecified: Secondary | ICD-10-CM | POA: Insufficient documentation

## 2019-09-20 DIAGNOSIS — R634 Abnormal weight loss: Secondary | ICD-10-CM | POA: Diagnosis present

## 2019-09-20 DIAGNOSIS — R5381 Other malaise: Secondary | ICD-10-CM

## 2019-09-20 DIAGNOSIS — R5383 Other fatigue: Secondary | ICD-10-CM | POA: Insufficient documentation

## 2019-09-20 MED ORDER — IOHEXOL 300 MG/ML  SOLN
100.0000 mL | Freq: Once | INTRAMUSCULAR | Status: AC | PRN
  Administered 2019-09-20: 100 mL via INTRAVENOUS

## 2019-11-24 ENCOUNTER — Ambulatory Visit: Payer: Medicare Other | Attending: Internal Medicine

## 2019-11-24 DIAGNOSIS — Z23 Encounter for immunization: Secondary | ICD-10-CM | POA: Insufficient documentation

## 2019-11-24 NOTE — Progress Notes (Signed)
   Covid-19 Vaccination Clinic  Name:  Carlos Morrison    MRN: [id number] DOB: 04/02/1941  11/24/2019  [name] was observed post Covid-19 immunization for 15 minutes without incidence. He was provided with Vaccine Information Sheet and instruction to access the V-Safe system.   [name] was instructed to call 911 with any severe reactions post vaccine: Marland Kitchen Difficulty breathing  . Swelling of your face and throat  . A fast heartbeat  . A bad rash all over your body  . Dizziness and weakness    Immunizations Administered    Name Date Dose VIS Date Route   Pfizer COVID-19 Vaccine 11/24/2019 11:09 AM 0.3 mL 09/22/2019 Intramuscular   Manufacturer: [employer], [employer]   Lot: [id number]   NDC: [address]-4

## 2019-12-20 ENCOUNTER — Ambulatory Visit: Payer: Medicare Other | Attending: Internal Medicine

## 2019-12-20 DIAGNOSIS — Z23 Encounter for immunization: Secondary | ICD-10-CM

## 2019-12-20 NOTE — Progress Notes (Signed)
   Covid-19 Vaccination Clinic  Name:  Carlos Morrison    MRN: [id number] DOB: 03-03-1941  12/20/2019  Mr. Carlos Morrison was observed post Covid-19 immunization for 15 minutes without incident. He was provided with Vaccine Information Sheet and instruction to access the V-Safe system.   Mr. Carlos Morrison was instructed to call 911 with any severe reactions post vaccine: Marland Kitchen Difficulty breathing  . Swelling of face and throat  . A fast heartbeat  . A bad rash all over body  . Dizziness and weakness   Immunizations Administered    Name Date Dose VIS Date Route   Pfizer COVID-19 Vaccine 12/20/2019  9:39 AM 0.3 mL 09/22/2019 Intramuscular   Manufacturer: [employer], [employer]   Lot: [id number]   NDC: [address]-3

## 2021-01-21 ENCOUNTER — Encounter: Payer: Self-pay | Admitting: Ophthalmology

## 2021-01-27 NOTE — Discharge Instructions (Signed)

## 2021-01-29 ENCOUNTER — Encounter: Payer: Self-pay | Admitting: Ophthalmology

## 2021-01-29 ENCOUNTER — Ambulatory Visit: Payer: Medicare Other | Admitting: Anesthesiology

## 2021-01-29 ENCOUNTER — Other Ambulatory Visit: Payer: Self-pay

## 2021-01-29 ENCOUNTER — Ambulatory Visit
Admission: RE | Admit: 2021-01-29 | Discharge: 2021-01-29 | Disposition: A | Discharge status: Home / Self Care | Payer: Medicare Other | Attending: Ophthalmology | Admitting: Ophthalmology

## 2021-01-29 ENCOUNTER — Encounter
Admission: RE | Disposition: A | Discharge status: Home / Self Care | Payer: Self-pay | Source: Home / Self Care | Attending: Ophthalmology

## 2021-01-29 DIAGNOSIS — Z961 Presence of intraocular lens: Secondary | ICD-10-CM | POA: Diagnosis not present

## 2021-01-29 DIAGNOSIS — H2511 Age-related nuclear cataract, right eye: Secondary | ICD-10-CM | POA: Insufficient documentation

## 2021-01-29 DIAGNOSIS — Z79899 Other long term (current) drug therapy: Secondary | ICD-10-CM | POA: Insufficient documentation

## 2021-01-29 DIAGNOSIS — Z9842 Cataract extraction status, left eye: Secondary | ICD-10-CM | POA: Diagnosis not present

## 2021-01-29 DIAGNOSIS — Z791 Long term (current) use of non-steroidal anti-inflammatories (NSAID): Secondary | ICD-10-CM | POA: Insufficient documentation

## 2021-01-29 HISTORY — PX: CATARACT EXTRACTION W/PHACO: SHX586

## 2021-01-29 HISTORY — DX: Unspecified osteoarthritis, unspecified site: M19.90

## 2021-01-29 SURGERY — PHACOEMULSIFICATION, CATARACT, WITH IOL INSERTION
Anesthesia: Monitor Anesthesia Care | Site: Eye | Laterality: Right

## 2021-01-29 MED ORDER — EPINEPHRINE PF 1 MG/ML IJ SOLN
INTRAOCULAR | Status: DC | PRN
  Administered 2021-01-29: 79 mL via OPHTHALMIC

## 2021-01-29 MED ORDER — ACETAMINOPHEN 325 MG PO TABS: 325.0000 mg | ORAL_TABLET | Freq: Once | ORAL | Status: DC

## 2021-01-29 MED ORDER — TETRACAINE HCL 0.5 % OP SOLN
1.0000 [drp] | OPHTHALMIC | Status: DC | PRN
  Administered 2021-01-29 (×3): 1 [drp] via OPHTHALMIC

## 2021-01-29 MED ORDER — LIDOCAINE HCL (PF) 2 % IJ SOLN
INTRAOCULAR | Status: DC | PRN
  Administered 2021-01-29: 1 mL

## 2021-01-29 MED ORDER — FENTANYL CITRATE (PF) 100 MCG/2ML IJ SOLN
INTRAMUSCULAR | Status: DC | PRN
  Administered 2021-01-29: 75 ug via INTRAVENOUS

## 2021-01-29 MED ORDER — BRIMONIDINE TARTRATE-TIMOLOL 0.2-0.5 % OP SOLN
OPHTHALMIC | Status: DC | PRN
  Administered 2021-01-29: 1 [drp] via OPHTHALMIC

## 2021-01-29 MED ORDER — MIDAZOLAM HCL 2 MG/2ML IJ SOLN
INTRAMUSCULAR | Status: DC | PRN
  Administered 2021-01-29: 1.5 mg via INTRAVENOUS

## 2021-01-29 MED ORDER — CEFUROXIME OPHTHALMIC INJECTION 1 MG/0.1 ML
INJECTION | OPHTHALMIC | Status: DC | PRN
  Administered 2021-01-29: 0.1 mL via INTRACAMERAL

## 2021-01-29 MED ORDER — NA HYALUR & NA CHOND-NA HYALUR 0.4-0.35 ML IO KIT
PACK | INTRAOCULAR | Status: DC | PRN
  Administered 2021-01-29: 1 mL via INTRAOCULAR

## 2021-01-29 MED ORDER — ACETAMINOPHEN 160 MG/5ML PO SOLN: 325.0000 mg | Freq: Once | ORAL | Status: DC

## 2021-01-29 MED ORDER — LACTATED RINGERS IV SOLN: INTRAVENOUS | Status: DC

## 2021-01-29 MED ORDER — ARMC OPHTHALMIC DILATING DROPS
1.0000 "application " | OPHTHALMIC | Status: DC | PRN
  Administered 2021-01-29 (×3): 1 via OPHTHALMIC

## 2021-01-29 SURGICAL SUPPLY — 25 items
CANNULA ANT/CHMB 27GA (MISCELLANEOUS) ×2 IMPLANT
GLOVE SURG ENC TEXT LTX SZ7.5 (GLOVE) ×4 IMPLANT
GLOVE SURG TRIUMPH 8.0 PF LTX (GLOVE) ×2 IMPLANT
GOWN STRL REUS W/ TWL LRG LVL3 (GOWN DISPOSABLE) ×2 IMPLANT
GOWN STRL REUS W/TWL LRG LVL3 (GOWN DISPOSABLE) ×4
LENS IOL DIOP 19.0 (Intraocular Lens) ×2 IMPLANT
LENS IOL TECNIS MONO 19.0 (Intraocular Lens) ×1 IMPLANT
MARKER SKIN DUAL TIP RULER LAB (MISCELLANEOUS) ×2 IMPLANT
NDL RETROBULBAR .5 NSTRL (NEEDLE) IMPLANT
NEEDLE CAPSULORHEX 25GA (NEEDLE) ×2 IMPLANT
NEEDLE FILTER BLUNT 18X 1/2SAF (NEEDLE) ×2
NEEDLE FILTER BLUNT 18X1 1/2 (NEEDLE) ×2 IMPLANT
PACK CATARACT BRASINGTON (MISCELLANEOUS) ×2 IMPLANT
PACK EYE AFTER SURG (MISCELLANEOUS) ×2 IMPLANT
PACK OPTHALMIC (MISCELLANEOUS) ×2 IMPLANT
RING MALYGIN 7.0 (MISCELLANEOUS) IMPLANT
SOLUTION OPHTHALMIC SALT (MISCELLANEOUS) ×2 IMPLANT
SUT ETHILON 10-0 CS-B-6CS-B-6 (SUTURE)
SUT VICRYL  9 0 (SUTURE)
SUT VICRYL 9 0 (SUTURE) IMPLANT
SUTURE EHLN 10-0 CS-B-6CS-B-6 (SUTURE) IMPLANT
SYR 3ML LL SCALE MARK (SYRINGE) ×4 IMPLANT
SYR TB 1ML LUER SLIP (SYRINGE) ×2 IMPLANT
WATER STERILE IRR 250ML POUR (IV SOLUTION) ×2 IMPLANT
WIPE NON LINTING 3.25X3.25 (MISCELLANEOUS) ×2 IMPLANT

## 2021-01-29 NOTE — Anesthesia Procedure Notes (Signed)
Procedure Name: MAC Date/Time: 01/29/2021 10:20 AM Performed by: Silvana Newness, CRNA Pre-anesthesia Checklist: Patient identified, Emergency Drugs available, Suction available, Patient being monitored and Timeout performed Patient Re-evaluated:Patient Re-evaluated prior to induction Oxygen Delivery Method: Nasal cannula Placement Confirmation: positive ETCO2

## 2021-01-29 NOTE — Anesthesia Preprocedure Evaluation (Signed)
Anesthesia Evaluation  Patient identified by MRN, date of birth, ID band Patient awake    Reviewed: Allergy & Precautions, H&P , NPO status , Patient's Chart, lab work & pertinent test results  Airway Mallampati: II  TM Distance: >3 FB Neck ROM: full    Dental no notable dental hx.    Pulmonary    Pulmonary exam normal breath sounds clear to auscultation       Cardiovascular Normal cardiovascular exam Rhythm:regular Rate:Normal     Neuro/Psych    GI/Hepatic GERD  ,  Endo/Other    Renal/GU      Musculoskeletal   Abdominal   Peds  Hematology   Anesthesia Other Findings   Reproductive/Obstetrics                             Anesthesia Physical Anesthesia Plan  ASA: II  Anesthesia Plan: MAC   Post-op Pain Management:    Induction:   PONV Risk Score and Plan: 1 and Treatment may vary due to age or medical condition, TIVA and Midazolam  Airway Management Planned:   Additional Equipment:   Intra-op Plan:   Post-operative Plan:   Informed Consent: I have reviewed the patients History and Physical, chart, labs and discussed the procedure including the risks, benefits and alternatives for the proposed anesthesia with the patient or authorized representative who has indicated his/her understanding and acceptance.     Dental Advisory Given  Plan Discussed with: CRNA  Anesthesia Plan Comments:         Anesthesia Quick Evaluation

## 2021-01-29 NOTE — Anesthesia Postprocedure Evaluation (Signed)
Anesthesia Post Note  Patient: Carlos Morrison  Procedure(s) Performed: CATARACT EXTRACTION PHACO AND INTRAOCULAR LENS PLACEMENT (IOC) RIGHT (Right Eye)     Patient location during evaluation: PACU Anesthesia Type: MAC Level of consciousness: awake and alert and oriented Pain management: satisfactory to patient Vital Signs Assessment: post-procedure vital signs reviewed and stable Respiratory status: spontaneous breathing, nonlabored ventilation and respiratory function stable Cardiovascular status: blood pressure returned to baseline and stable Postop Assessment: Adequate PO intake and No signs of nausea or vomiting Anesthetic complications: no   No complications documented.  Raliegh Ip

## 2021-01-29 NOTE — H&P (Signed)
[facility]   Primary Care Physician:  Barbette Reichmann, MD Ophthalmologist: Dr. Lockie Mola  Pre-Procedure History & Physical: HPI:  Carlos Morrison is a 80 y.o. male here for ophthalmic surgery.   Past Medical History:  Diagnosis Date  . Arthritis    Bilateral knees  . GERD (gastroesophageal reflux disease)   . Hyperlipidemia     Past Surgical History:  Procedure Laterality Date  . CATARACT EXTRACTION W/PHACO Left 03/30/2018   Procedure: CATARACT EXTRACTION PHACO AND INTRAOCULAR LENS PLACEMENT (IOC)  LEFT;  Surgeon: Lockie Mola, MD;  [facility]: [facility];  Service: Ophthalmology;  Laterality: Left;  . COLONOSCOPY  2011   Dr. Kizzie Ide  . COLONOSCOPY N/A 05/01/2015   Procedure: COLONOSCOPY;  Surgeon: Earline Mayotte, MD;  Location: [facility] ENDOSCOPY;  Service: Endoscopy;  Laterality: N/A;  . GANGLION CYST EXCISION Left 08/2006    Prior to Admission medications   Medication Sig Start Date End Date Taking? Authorizing Provider  buPROPion (WELLBUTRIN XL) 150 MG 24 hr tablet Take 150 mg by mouth daily.   Yes [provider]  meloxicam (MOBIC) 7.5 MG tablet Take 7.5 mg by mouth daily.   Yes [provider]  omeprazole (PRILOSEC) 40 MG capsule TAKE ONE PILL ONCE DAILY, 30 MINUTES PRIOR TO EVENING MEAL. 07/31/17  Yes [provider]  polyethylene glycol (MIRALAX / GLYCOLAX) packet Take 17 g by mouth daily as needed.   Yes [provider]  sildenafil (VIAGRA) 100 MG tablet Take 100 mg by mouth daily as needed for erectile dysfunction.   Yes [provider]  simvastatin (ZOCOR) 10 MG tablet Take 10 mg by mouth daily.   Yes [provider]  linaclotide (LINZESS) 72 MCG capsule Take by mouth. Patient not taking: Reported on 01/21/2021 08/25/17   [provider]    Allergies as of 11/12/2020  . (No Known Allergies)    Family History  Problem Relation Age of Onset  . Prostate cancer Neg Hx   .  Bladder Cancer Neg Hx   . Kidney cancer Neg Hx     Social History   Socioeconomic History  . Marital status: Married    Spouse name: Not on file  . Number of children: Not on file  . Years of education: Not on file  . Highest education level: Not on file  Occupational History  . Not on file  Tobacco Use  . Smoking status: Never Smoker  . Smokeless tobacco: Never Used  Vaping Use  . Vaping Use: Never used  Substance and Sexual Activity  . Alcohol use: Yes    Alcohol/week: 0.0 standard drinks    Comment: occasionally - Holidays  . Drug use: No  . Sexual activity: Not on file  Other Topics Concern  . Not on file  Social History Narrative  . Not on file   Social Determinants of Health   Financial Resource Strain: Not on file  Food Insecurity: Not on file  Transportation Needs: Not on file  Physical Activity: Not on file  Stress: Not on file  Social Connections: Not on file  Intimate Partner Violence: Not on file    Review of Systems: See HPI, otherwise negative ROS  Physical Exam: BP 136/86   Pulse 92   Temp (!) 97.2 F (36.2 C) (Temporal)   Resp 16   Ht 5\' 8"  (1.727 m)   Wt 74.3 kg   SpO2 100%   BMI 24.92 kg/m  General:   Alert,  pleasant  and cooperative in NAD Head:  Normocephalic and atraumatic. Lungs:  Clear to auscultation.    Heart:  Regular rate and rhythm.   Impression/Plan: Carlos Morrison is here for ophthalmic surgery.  Risks, benefits, limitations, and alternatives regarding ophthalmic surgery have been reviewed with the patient.  Questions have been answered.  All parties agreeable.   Lockie Mola, MD  01/29/2021, 9:49 AM

## 2021-01-29 NOTE — Transfer of Care (Signed)
Immediate Anesthesia Transfer of Care Note  Patient: Carlos Morrison  Procedure(s) Performed: CATARACT EXTRACTION PHACO AND INTRAOCULAR LENS PLACEMENT (IOC) RIGHT (Right Eye)  Patient Location: PACU  Anesthesia Type: MAC  Level of Consciousness: awake, alert  and patient cooperative  Airway and Oxygen Therapy: Patient Spontanous Breathing and Patient connected to supplemental oxygen  Post-op Assessment: Post-op Vital signs reviewed, Patient's Cardiovascular Status Stable, Respiratory Function Stable, Patent Airway and No signs of Nausea or vomiting  Post-op Vital Signs: Reviewed and stable  Complications: No complications documented.

## 2021-01-29 NOTE — Op Note (Signed)
  LOCATION:  [facility]   PREOPERATIVE DIAGNOSIS:    Nuclear sclerotic cataract right eye. H25.11   POSTOPERATIVE DIAGNOSIS:  Nuclear sclerotic cataract right eye.     PROCEDURE:  Phacoemusification with posterior chamber intraocular lens placement of the right eye   ULTRASOUND TIME: Procedure(s) with comments: CATARACT EXTRACTION PHACO AND INTRAOCULAR LENS PLACEMENT (IOC) RIGHT (Right) - 3.24 1:03.0 5.18%  LENS:   Implant Name Type Inv. Item Serial No. Manufacturer Lot No. LRB No. Used Action  LENS IOL DIOP 19.0 - [id number] Intraocular Lens LENS IOL DIOP 19.0 [id number]   Right 1 Implanted         SURGEON:  Deirdre Evener, MD   ANESTHESIA:  Topical with tetracaine drops and 2% Xylocaine jelly, augmented with 1% preservative-free intracameral lidocaine.    COMPLICATIONS:  None.   DESCRIPTION OF PROCEDURE:  The patient was identified in the holding room and transported to the operating room and placed in the supine position under the operating microscope.  The right eye was identified as the operative eye and it was prepped and draped in the usual sterile ophthalmic fashion.   A 1 millimeter clear-corneal paracentesis was made at the 12:00 position.  0.5 ml of preservative-free 1% lidocaine was injected into the anterior chamber. The anterior chamber was filled with Viscoat viscoelastic.  A 2.4 millimeter keratome was used to make a near-clear corneal incision at the 9:00 position.  A curvilinear capsulorrhexis was made with a cystotome and capsulorrhexis forceps.  Balanced salt solution was used to hydrodissect and hydrodelineate the nucleus.   Phacoemulsification was then used in stop and chop fashion to remove the lens nucleus and epinucleus.  The remaining cortex was then removed using the irrigation and aspiration handpiece. Provisc was then placed into the capsular bag to distend it for lens placement.  A lens was then injected into the capsular bag.   The remaining viscoelastic was aspirated.   Wounds were hydrated with balanced salt solution.  The anterior chamber was inflated to a physiologic pressure with balanced salt solution.  No wound leaks were noted. Cefuroxime 0.1 ml of a 10mg /ml solution was injected into the anterior chamber for a dose of 1 mg of intracameral antibiotic at the completion of the case.   Timolol and Brimonidine drops were applied to the eye.  The patient was taken to the recovery room in stable condition without complications of anesthesia or surgery.   Vivi Piccirilli 01/29/2021, 10:39 AM

## 2021-01-30 ENCOUNTER — Encounter: Payer: Self-pay | Admitting: Ophthalmology

## 2021-09-29 ENCOUNTER — Other Ambulatory Visit (HOSPITAL_COMMUNITY): Payer: Self-pay | Admitting: Neurology

## 2021-09-29 ENCOUNTER — Other Ambulatory Visit: Payer: Self-pay | Admitting: Neurology

## 2021-09-29 DIAGNOSIS — G20 Parkinson's disease: Secondary | ICD-10-CM

## 2021-10-16 ENCOUNTER — Ambulatory Visit
Admission: RE | Admit: 2021-10-16 | Discharge: 2021-10-16 | Disposition: A | Discharge status: Home / Self Care | Payer: Medicare Other | Source: Ambulatory Visit | Attending: Neurology | Admitting: Neurology

## 2021-10-16 DIAGNOSIS — G20 Parkinson's disease: Secondary | ICD-10-CM | POA: Diagnosis not present

## 2022-05-17 IMAGING — MR MR HEAD W/O CM
12 series · 47 of 48 positions shown · non-contrast
Comparison: No pertinent prior exams available for comparison.

CLINICAL DATA: Provided history: Parkinson's disease. Additional
history provided by scanning technologist: Patient reports hand
"shaking" for 1 year, balance difficulty, blurry vision.

EXAM:
MRI HEAD WITHOUT CONTRAST
TECHNIQUE: Multiplanar, multiecho pulse sequences of the brain and surrounding
structures were obtained without intravenous contrast.

[Series 5: ax dwi_tracew · axial · 3.0mm · 0.65mm/px · z∈[-113,+39]mm · 4 of 48 slices shown]
[im 1/48]
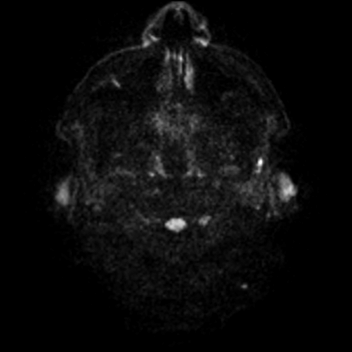
[im 16/48]
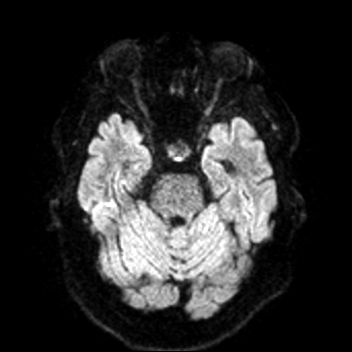
[im 32/48]
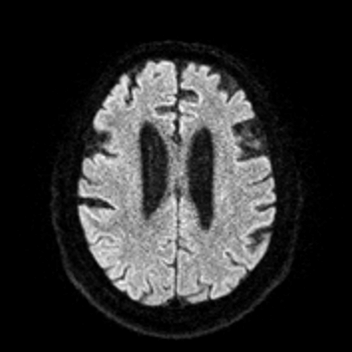
[im 48/48]
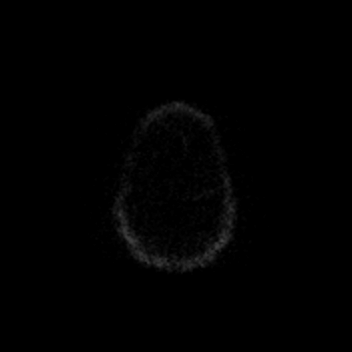

[Series 6: ax dwi_adc · axial · 3.0mm · 0.65mm/px · z∈[-113,+39]mm · 3 of 48 slices shown]
[im 1/48]
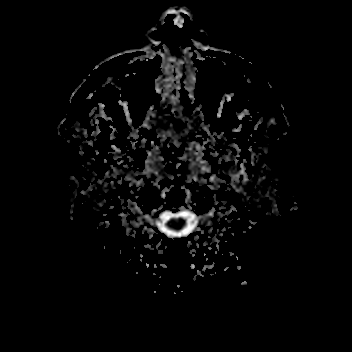
[im 24/48]
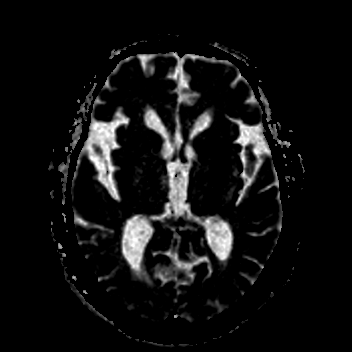
[im 48/48]
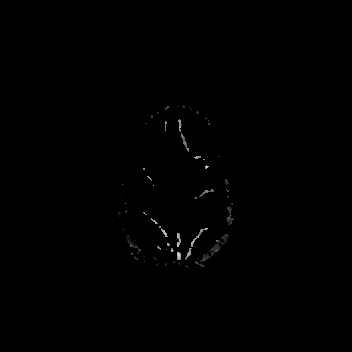

[Series 7: cor dwi_tracew · coronal · 5.0mm · 0.65mm/px · 3 of 40 slices shown]
[im 1/40]
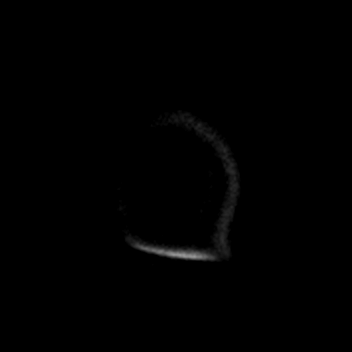
[im 20/40]
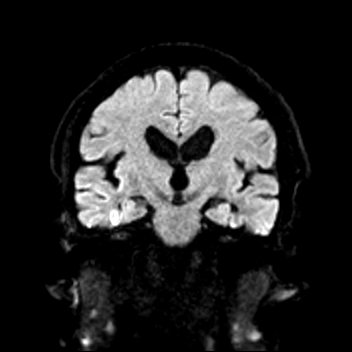
[im 40/40]
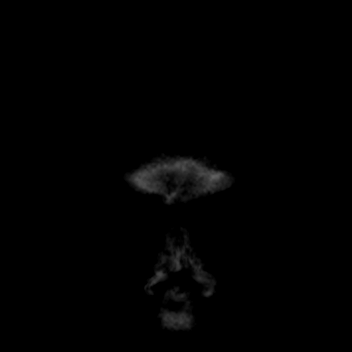

[Series 8: cor dwi_adc · coronal · 5.0mm · 0.65mm/px · 3 of 40 slices shown]
[im 1/40]
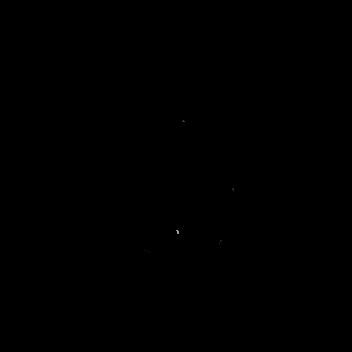
[im 20/40]
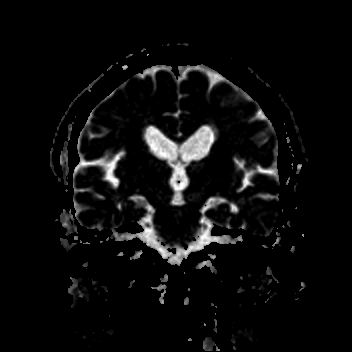
[im 40/40]
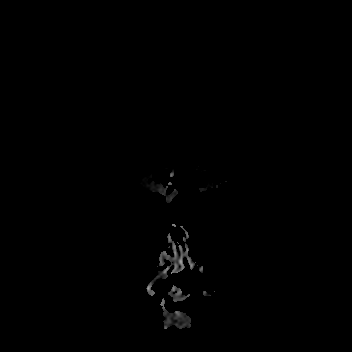

[Series 9: T1 · sagittal · 5.0mm · 0.62mm/px · 2 of 23 slices shown (1 of 2)]
[im 1/23]
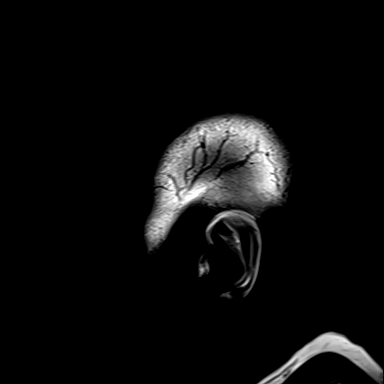
[im 23/23]
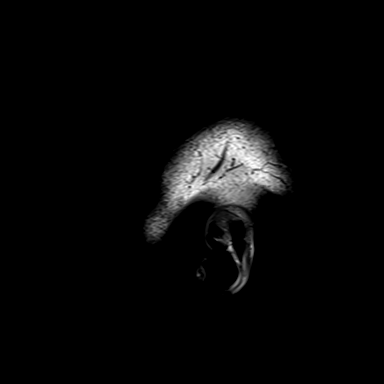

[Series 10: T2 · axial · 5.0mm · 0.53mm/px · z∈[-107,+34]mm · 2 of 25 slices shown (1 of 2)]
[im 1/25]
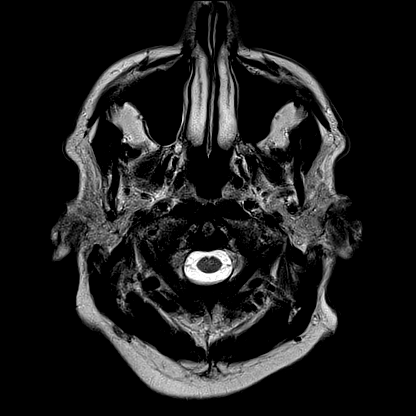
[im 25/25]
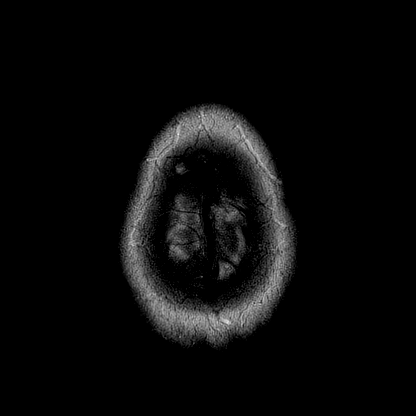

[Series 11: mag_images · axial · 3.0mm · 0.90mm/px · z∈[-122,+51]mm · 4 of 60 slices shown]
[im 1/60]
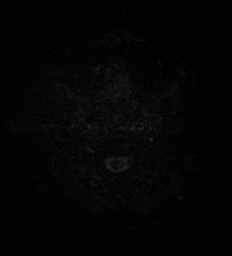
[im 20/60]
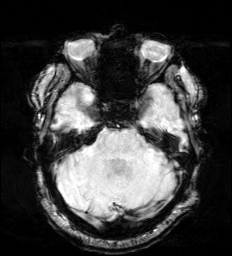
[im 40/60]
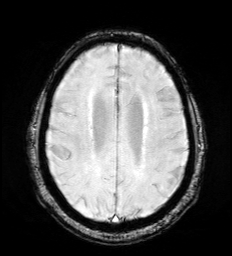
[im 60/60]
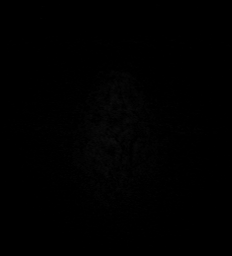

[Series 12: pha_images · axial · 3.0mm · 0.90mm/px · z∈[-122,+51]mm · 4 of 60 slices shown]
[im 1/60]
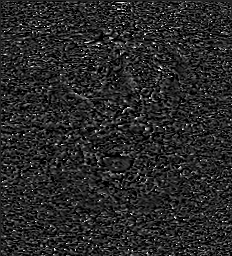
[im 20/60]
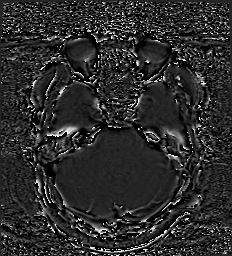
[im 40/60]
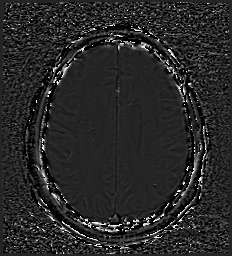
[im 60/60]
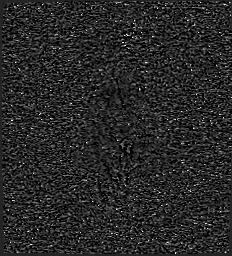

[Series 13: swi_images · axial · 3.0mm · 0.90mm/px · z∈[-122,+51]mm · 4 of 60 slices shown]
[im 1/60]
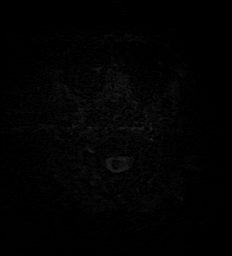
[im 20/60]
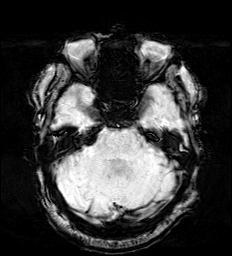
[im 40/60]
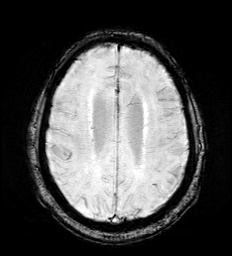
[im 60/60]
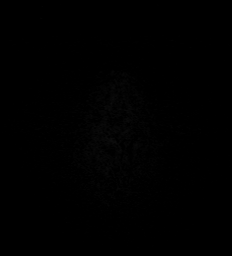

[Series 15: FLAIR · axial · 3.0mm · 0.53mm/px · z∈[-116,+43]mm · 4 of 55 slices shown]
[im 1/55]
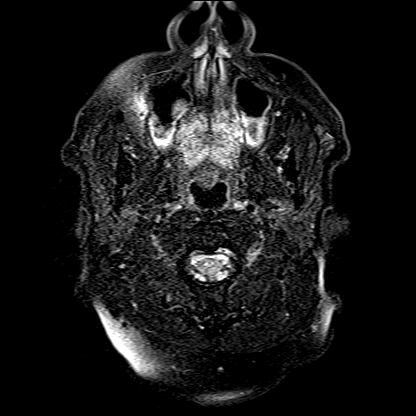
[im 19/55]
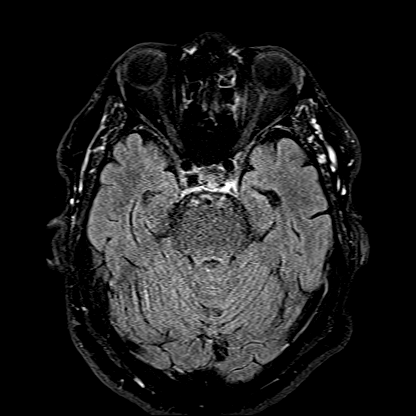
[im 37/55]
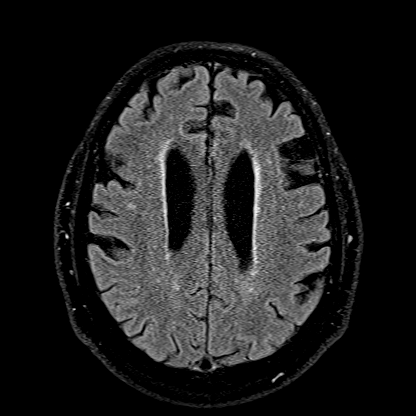
[im 55/55]
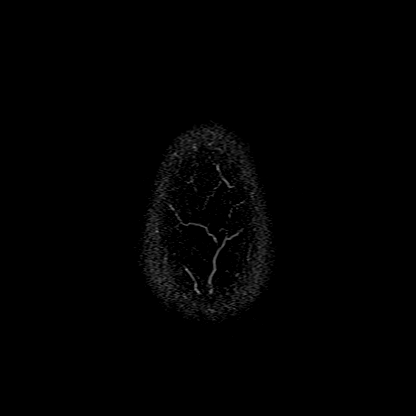

[Series 16: T1 · axial · 1.0mm · 0.98mm/px · z∈[-121,+51]mm · 12 of 176 slices shown (2 of 2)]
[im 1/176]
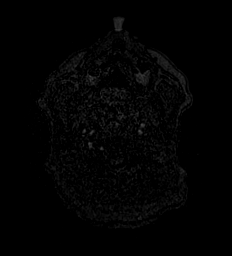
[im 15/176]
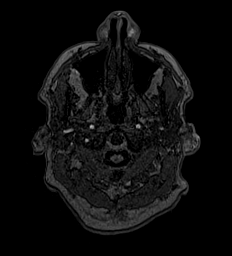
[im 30/176]
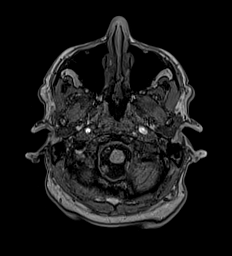
[im 44/176]
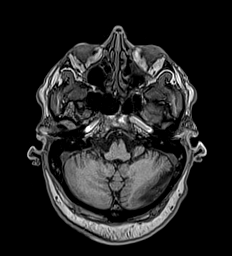
[im 59/176]
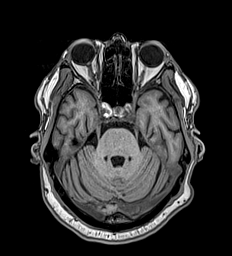
[im 73/176]
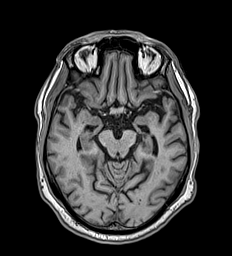
[im 88/176]
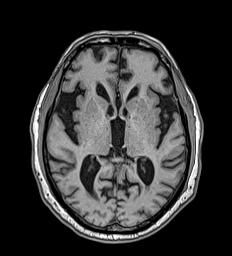
[im 103/176]
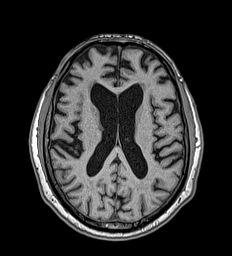
[im 117/176]
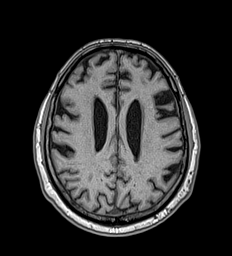
[im 132/176]
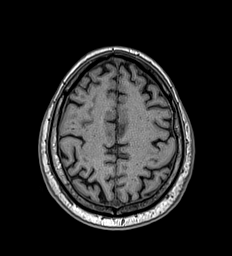
[im 146/176]
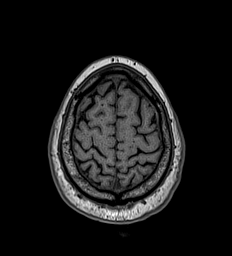
[im 176/176]
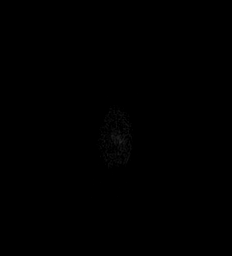

[Series 17: T2 · coronal · 5.0mm · 0.57mm/px · 2 of 29 slices shown (2 of 2)]
[im 1/29]
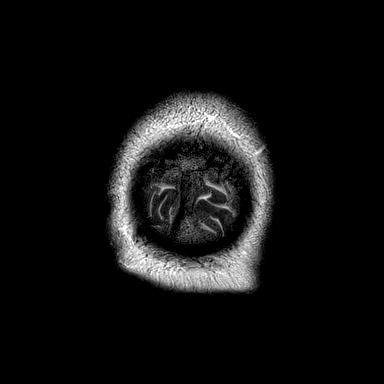
[im 29/29]
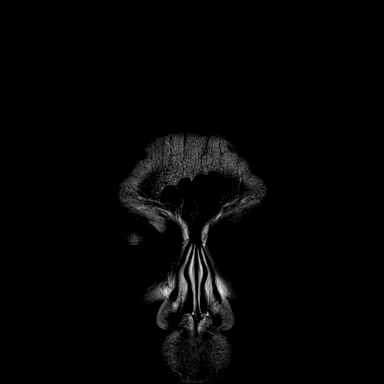

[47 of 48 positions shown; findings below may reference images not displayed]

FINDINGS: Brain:

Mild generalized cerebral and cerebellar atrophy.

Mild multifocal T2 FLAIR hyperintense signal abnormality within the
cerebral white matter, nonspecific but compatible with chronic small
vessel ischemic disease.

There is no acute infarct.

No evidence of an intracranial mass.

No chronic intracranial blood products.

No extra-axial fluid collection.

No midline shift.

Vascular: Maintained flow voids within the proximal large arterial
vessels.

Skull and upper cervical spine: No focal suspicious marrow lesion.
Incompletely assessed cervical spondylosis.

Sinuses/Orbits: Visualized orbits show no acute finding. Mild
mucosal thickening within the bilateral ethmoid sinuses. Trace
mucosal thickening and small mucous retention cyst within the right
maxillary sinus.

Other: Trace fluid within the bilateral mastoid air cells.
IMPRESSION: No evidence of acute intracranial abnormality.

Mild chronic small vessel ischemic changes within the cerebral white
matter.

Mild generalized cerebral and cerebellar atrophy.

Mild paranasal sinus disease, as described.

Trace fluid within the bilateral mastoid air cells.

## 2022-10-16 ENCOUNTER — Ambulatory Visit: Payer: Medicare Other | Attending: Neurology

## 2022-10-16 DIAGNOSIS — R278 Other lack of coordination: Secondary | ICD-10-CM | POA: Diagnosis not present

## 2022-10-16 DIAGNOSIS — R2689 Other abnormalities of gait and mobility: Secondary | ICD-10-CM | POA: Diagnosis present

## 2022-10-16 DIAGNOSIS — M6281 Muscle weakness (generalized): Secondary | ICD-10-CM | POA: Insufficient documentation

## 2022-10-16 NOTE — Therapy (Signed)
OUTPATIENT OCCUPATIONAL THERAPY NEURO EVALUATION  Patient Name: Carlos Morrison MRN: [id number] DOB:September 09, 1941, 82 y.o., male Today's Date: 10/16/2022  PCP: *** REFERRING PROVIDER: ***  END OF SESSION:   Past Medical History:  Diagnosis Date   Arthritis    Bilateral knees   GERD (gastroesophageal reflux disease)    Hyperlipidemia    Past Surgical History:  Procedure Laterality Date   CATARACT EXTRACTION W/PHACO Left 03/30/2018   Procedure: CATARACT EXTRACTION PHACO AND INTRAOCULAR LENS PLACEMENT ([place])  LEFT;  Surgeon: Leandrew Koyanagi, MD;  Location: [place];  Service: Ophthalmology;  Laterality: Left;   CATARACT EXTRACTION W/PHACO Right 01/29/2021   Procedure: CATARACT EXTRACTION PHACO AND INTRAOCULAR LENS PLACEMENT ([place]) RIGHT;  Surgeon: Leandrew Koyanagi, MD;  Location: [place];  Service: Ophthalmology;  Laterality: Right;  3.24 1:03.0 5.18%   COLONOSCOPY  2011   Dr. Bobbye Charleston   COLONOSCOPY N/A 05/01/2015   Procedure: COLONOSCOPY;  Surgeon: Robert Bellow, MD;  Location: [facility];  Service: Endoscopy;  Laterality: N/A;   GANGLION CYST EXCISION Left 08/2006   Patient Active Problem List   Diagnosis Date Noted   CN (constipation) 03/19/2015    ONSET DATE: 2 years  REFERRING DIAG: ***  THERAPY DIAG:  No diagnosis found.  Rationale for Evaluation and Treatment: {HABREHAB:27488}  SUBJECTIVE:   SUBJECTIVE STATEMENT: *** Pt accompanied by: self and significant other  PERTINENT HISTORY: ***  PRECAUTIONS: {Therapy precautions:24002}  WEIGHT BEARING RESTRICTIONS: {Yes ***/No:24003}  PAIN:  Are you having pain? No  FALLS: Has patient fallen in last 6 months? No  LIVING ENVIRONMENT: Lives with: lives with their family, spouse and daughter Lives in: 2 level home Stairs: Yes: Internal: 1 flight steps; on left going up and External: 2 steps; on left going up Has following equipment at home: None  PLOF: {PLOF:24004}  PATIENT  GOALS:   OBJECTIVE:   HAND DOMINANCE: Left  ADLs: Overall ADLs: *** Transfers/ambulation related to ADLs: Eating: able to cut own food;  Grooming: *** UB Dressing: *** LB Dressing: *** Toileting: modified indep with elevated toilet seat Bathing: indep Tub Shower transfers: indep with walk in shower Equipment: {equipment:25573}  IADLs: Shopping: *** Light housekeeping: pt participates in light housekeeping, sweeping, vacuuming Meal Prep: spouse manages at baseline; pt can make his tea or coffee  Community mobility: fatigues with community mobility  Medication management: pt/spouse report pt is indep with medications  Financial management: pt and spouse manage together  Handwriting: {OTWRITTENEXPRESSION:25361}  MOBILITY STATUS: {OTMOBILITY:25360}  POSTURE COMMENTS:  {posture:25561} Sitting balance: {sitting balance:25483}  ACTIVITY TOLERANCE: Activity tolerance: ***  FUNCTIONAL OUTCOME MEASURES: FOTO: ***  UPPER EXTREMITY ROM:    {AROM/PROM:27142} ROM Right eval Left eval  Shoulder flexion    Shoulder abduction    Shoulder adduction    Shoulder extension    Shoulder internal rotation    Shoulder external rotation    Elbow flexion    Elbow extension    Wrist flexion    Wrist extension    Wrist ulnar deviation    Wrist radial deviation    Wrist pronation    Wrist supination    (Blank rows = not tested)  UPPER EXTREMITY MMT:   BUEs 5/5  MMT Right eval Left eval  Shoulder flexion    Shoulder abduction    Shoulder adduction    Shoulder extension    Shoulder internal rotation    Shoulder external rotation    Middle trapezius    Lower trapezius    Elbow flexion  Elbow extension    Wrist flexion    Wrist extension    Wrist ulnar deviation    Wrist radial deviation    Wrist pronation    Wrist supination    (Blank rows = not tested)  HAND FUNCTION: Grip strength: Right: 53 lbs; Left: 40 lbs, Lateral pinch: Right: 20 lbs, Left: 21 lbs, and 3  point pinch: Right: 14 lbs, Left: 14 lbs  COORDINATION: 9 Hole Peg test: Right: 42 sec; Left: 37 sec  SENSATION: {sensation:27233}  EDEMA: ***  MUSCLE TONE: {UETONE:25567}  COGNITION: Overall cognitive status: short term memory loss , processing information  VISION: Subjective report: wears glasses all the time, bifocals, hx of cataracts removed both sides  Baseline vision: {OTBASELINEVISION:25363} Visual history: {OTVISUALHISTORY:25364}  VISION ASSESSMENT: {visionassessment:27231}  Patient has difficulty with following activities due to following visual impairments: ***  PERCEPTION: {Perception:25564}  PRAXIS: {Praxis:25565}  OBSERVATIONS: ***   TODAY'S TREATMENT:                                                                                                                              DATE: ***   PATIENT EDUCATION: Education details: *** Person educated: {Person educated:25204} Education method: {Education Method:25205} Education comprehension: {Education Comprehension:25206}  HOME EXERCISE PROGRAM: ***   GOALS: Goals reviewed with patient? {yes/no:20286}  SHORT TERM GOALS: Target date: ***  *** Baseline: Goal status: {GOALSTATUS:25110}  2.  *** Baseline:  Goal status: {GOALSTATUS:25110}  3.  *** Baseline:  Goal status: {GOALSTATUS:25110}  4.  *** Baseline:  Goal status: {GOALSTATUS:25110}  5.  *** Baseline:  Goal status: {GOALSTATUS:25110}  6.  *** Baseline:  Goal status: {GOALSTATUS:25110}  LONG TERM GOALS: Target date: ***  *** Baseline:  Goal status: {GOALSTATUS:25110}  2.  *** Baseline:  Goal status: {GOALSTATUS:25110}  3.  *** Baseline:  Goal status: {GOALSTATUS:25110}  4.  *** Baseline:  Goal status: {GOALSTATUS:25110}  5.  *** Baseline:  Goal status: {GOALSTATUS:25110}  6.  *** Baseline:  Goal status: {GOALSTATUS:25110}  ASSESSMENT:  CLINICAL IMPRESSION: Patient is a *** y.o. *** who was seen today for  occupational therapy evaluation for ***.   PERFORMANCE DEFICITS: in functional skills including {OT physical skills:25468}, cognitive skills including {OT cognitive skills:25469}, and psychosocial skills including {OT psychosocial skills:25470}.   IMPAIRMENTS: are limiting patient from {OT performance deficits:25471}.   CO-MORBIDITIES: {Comorbidities:25485} that affects occupational performance. Patient will benefit from skilled OT to address above impairments and improve overall function.  MODIFICATION OR ASSISTANCE TO COMPLETE EVALUATION: {OT modification:25474}  OT OCCUPATIONAL PROFILE AND HISTORY: {OT PROFILE AND HISTORY:25484}  CLINICAL DECISION MAKING: {OT CDM:25475}  REHAB POTENTIAL: {rehabpotential:25112}  EVALUATION COMPLEXITY: {Evaluation complexity:25115}    PLAN:  OT FREQUENCY: {rehab frequency:25116}  OT DURATION: {rehab duration:25117}  PLANNED INTERVENTIONS: {OT Interventions:25467}  RECOMMENDED OTHER SERVICES: ***  CONSULTED AND AGREED WITH PLAN OF CARE: {YHO:88757}  PLAN FOR NEXT SESSION: ***   Darleene Cleaver, OT 10/16/2022, 10:19 AM

## 2022-10-19 ENCOUNTER — Ambulatory Visit: Payer: Medicare Other

## 2022-10-20 ENCOUNTER — Ambulatory Visit: Payer: Medicare Other

## 2022-10-20 DIAGNOSIS — R2689 Other abnormalities of gait and mobility: Secondary | ICD-10-CM

## 2022-10-20 DIAGNOSIS — M6281 Muscle weakness (generalized): Secondary | ICD-10-CM

## 2022-10-20 DIAGNOSIS — R278 Other lack of coordination: Secondary | ICD-10-CM

## 2022-10-21 ENCOUNTER — Ambulatory Visit: Payer: Medicare Other

## 2022-10-21 DIAGNOSIS — R278 Other lack of coordination: Secondary | ICD-10-CM | POA: Diagnosis not present

## 2022-10-21 DIAGNOSIS — M6281 Muscle weakness (generalized): Secondary | ICD-10-CM

## 2022-10-21 DIAGNOSIS — R2689 Other abnormalities of gait and mobility: Secondary | ICD-10-CM

## 2022-10-21 NOTE — Therapy (Signed)
OUTPATIENT OCCUPATIONAL THERAPY LSVT BIG NEURO TREATMENT  Patient Name: Carlos Morrison MRN: [id number] DOB:March 19, 1941, 82 y.o., male Today's Date: 10/21/2022  PCP: Dr. Barbette Reichmann REFERRING PROVIDER: Dr. Cristopher Peru  END OF SESSION:  OT End of Session - 10/21/22 0832     Visit Number 2    Number of Visits 17    Date for OT Re-Evaluation 11/13/22    Authorization Time Period Reporting period beginning 10/16/22    Progress Note Due on Visit 10    OT Start Time 1340    OT Stop Time 1443    OT Time Calculation (min) 63 min    Equipment Utilized During Treatment none    Activity Tolerance Patient tolerated treatment well    Behavior During Therapy WFL for tasks assessed/performed             Past Medical History:  Diagnosis Date   Arthritis    Bilateral knees   GERD (gastroesophageal reflux disease)    Hyperlipidemia    Past Surgical History:  Procedure Laterality Date   CATARACT EXTRACTION W/PHACO Left 03/30/2018   Procedure: CATARACT EXTRACTION PHACO AND INTRAOCULAR LENS PLACEMENT ([facility])  LEFT;  Surgeon: Lockie Mola, MD;  Location: [facility];  Service: Ophthalmology;  Laterality: Left;   CATARACT EXTRACTION W/PHACO Right 01/29/2021   Procedure: CATARACT EXTRACTION PHACO AND INTRAOCULAR LENS PLACEMENT ([facility]) RIGHT;  Surgeon: Lockie Mola, MD;  Location: [facility];  Service: Ophthalmology;  Laterality: Right;  3.24 1:03.0 5.18%   COLONOSCOPY  2011   Dr. Kizzie Ide   COLONOSCOPY N/A 05/01/2015   Procedure: COLONOSCOPY;  Surgeon: Earline Mayotte, MD;  Location[facility];  Service: Endoscopy;  Laterality: N/A;   GANGLION CYST EXCISION Left 08/2006   Patient Active Problem List   Diagnosis Date Noted   CN (constipation) 03/19/2015    ONSET DATE: 2 years  REFERRING DIAG: Parkinson's Disease  THERAPY DIAG:  Muscle weakness (generalized)  Other lack of coordination  Other abnormalities of gait and mobility  Rationale for  Evaluation and Treatment: Rehabilitation  SUBJECTIVE:  SUBJECTIVE STATEMENT: Pt and spouse report doing well this date.  Pt accompanied by: self and significant other; spouse, "[name]"  PERTINENT HISTORY: Per note from Dr. Sherryll Burger on 09/22/22 Carlos Morrison is a left handed 82 y.o. 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  Parkinson's Disease in a patient with unilateral left hand resting tremor, loss of sense of smell, imbalance, dream enactment, constipation, shuffling gait, short steps, decreased arm swing (R > L), slowness of the movements, feeling of stiffness: Patient reports having a left sided tremor and imbalance since about 2021. He reports it is hard for him to stand for a long time. Patient reports loss of sense of smell in about 2005 and constipation since about 2015 (Miralax and dulcolax). Patient also reports pulling sensation to the left. Patient reports he has always been soft-spoken. Patient reports no family history of Parkinson's disease.  At initial evaluation Carlos Morrison denied decreased volume of speech, drooling, difficulty swallowing, decreased facial expressions, small hand writing, hunched forward posture, transient visual hallucination, depression, anxiety, urinary urgency, sexual dysfunction, drop in blood pressure with change in posture etc.   PRECAUTIONS: Fall  WEIGHT BEARING RESTRICTIONS: No  PAIN:  Are you having pain? No  FALLS: Has patient fallen in last 6 months? No  LIVING ENVIRONMENT: Lives with: lives with their family, spouse and daughter Lives in: 2 level home Stairs: Yes: Internal: 1 flight  steps; on left going up and External: 2 steps; on left going up Has following equipment at home: None  PLOF: Independent  PATIENT GOALS: Improve balance  OBJECTIVE:   HAND DOMINANCE: Left  ADLs: Overall ADLs: indep Transfers/ambulation related to ADLs: indep Eating: able to cut own food Grooming: indep UB Dressing:  indep LB Dressing: indep Toileting: modified indep with elevated toilet seat Bathing: indep Tub Shower transfers: indep with walk in shower Equipment: none  IADLs: Shopping: spouse manages  Light housekeeping: pt participates in light housekeeping, sweeping, vacuuming Meal Prep: spouse manages at main meals at baseline; pt can make his tea or coffee  Community mobility: fatigues with community mobility  Medication management: pt/spouse report pt is indep with medications  Financial management: pt and spouse manage together  Handwriting: 100% legible and Increased time  MOBILITY STATUS:  Shuffling of gait; pt endorses some occasional freezing, decreased arm swing bilaterally but worse on the R  POSTURE COMMENTS:  rounded shoulders Sitting balance: Moves/returns truncal midpoint >2 inches in all planes  ACTIVITY TOLERANCE: Activity tolerance: Pt reports fatigue with community mobility.   FUNCTIONAL OUTCOME MEASURES: FOTO: 51; predicted 62  UPPER EXTREMITY ROM:  BUEs WFL  UPPER EXTREMITY MMT:   BUEs 5/5  HAND FUNCTION: Grip strength: Right: 53 lbs; Left: 40 lbs, Lateral pinch: Right: 20 lbs, Left: 21 lbs, and 3 point pinch: Right: 14 lbs, Left: 14 lbs  COORDINATION: 9 Hole Peg test: Right: 42 sec; Left: 37 sec  5x STS: 15 sec (>16 sec is fall risk) BERG: 49/56 (low fall risk); difficulty with tandem stance (unable to hold heel to toe), forward reach (9"), single leg stance (R leg 3 sec), and turning to the R 360* (5 sec)  6 MWT: Will plan to complete next session  SENSATION: To be further assessed  MUSCLE TONE: Pt endorses some general stiffness; no rigidity noted   COGNITION: Overall cognitive status: decreased short term memory, increased time for processing information (increased time for 1 and 2 step commands)  VISION: Subjective report: wears glasses all the time, bifocals, hx of cataracts removed both sides   PERCEPTION: WFL  PRAXIS: Impaired: Motor  planning; FOGQ: 5/24 (minimal freezing of gait)  OBSERVATIONS: Noted shuffling gait, resting LUE tremor, decreased arm swing bilaterally but R>L  TODAY'S TREATMENT:                                                                                                                              DATE: 10/20/22 LSVT: Therapeutic Exercise: Completed 6 MWT: 1,302 ft.  Able to perform without seated or standing rest break.   Self Care: Completed ABC scale: 1310/16= 82% of self confidence   Neuro re-ed: Patient seen for LSVT Daily Session Maximal Daily Exercises for facilitation/coordination of movement Maximum Sustained Movements are designed to rescale the amplitude of movement output for generalization to daily functional activities. Performed as follows for 1 set of 10 repetitions each multi-directional sustained movements:  1) Floor to ceiling  2) Side to side multidirectional   Repetitive movements performed in standing and are designed to provide retraining effort needed for sustained muscle activation in tasks. Performed as follows for 1 set of 10 repetitions each of multi-directional repetitive movements: 3) Step and reach forward step 4) Step and reach sideways step 5) Step and reach backwards step 6) Rock and reach forward/backward  7) Rock and reach sideways   Functional Component Task: Sit to stand BIG functional component task with supervision 5 reps (completed today with vc for full erect standing posture with each rep) 2. Threading zipper 3. Picking an object up from floor  4. Transferring from low chair 5. TBD  Hierarchy task: Writing: to be further assessed.  Signature and printed name at eval was written with 100% legibility with increased time.  PCP note indicates some micrographia.  Will assess further with sentence and paragraph writing.   Patient given daily carryover task to complete. Patient to practice performing sit to stand without UE support when able.  BIG  ambulation: (6 MWT this date, see above)  PATIENT EDUCATION: Education details: maximal daily exercises Person educated: Patient and Spouse Education method: Explanation, vc, tactile cues, demo, written handout Education comprehension: verbalized understanding, further training needed  HOME EXERCISE PROGRAM: Maximal daily exercises 2x daily   GOALS: Goals reviewed with patient? yes  SHORT TERM GOALS: Target date: 10/30/22 (2 weeks)  Pt will perform maximal daily exercises with written handouts and mod vc from spouse. Baseline: Will initiate next session Goal status: INITIAL  2.  Pt will complete 360 turn test R/L in 4 sec or less to decrease fall risk with directional changes. Baseline: R 5 sec, L 4 sec Goal status: INITIAL   LONG TERM GOALS: Target date: 11/13/22 (4 weeks)  Pt will increase FOTO score by 5 or more points to indicate clinically relevant improvement in daily tasks. Baseline: Eval: 51; predicted 62 Goal status: INITIAL  2.  Pt will demo increased tolerance for community mobility as noted by increase in 6 MWT by 100 ft. Baseline: Eval NT d/t time constraints: To be performed next session; 10/20/22: 1,302 ft Goal status: INITIAL  3.  Pt will demo increased balance with ADLs and mobility as noted by increase in BERG balance score by 3 points) Baseline: Eval: 49/56 Goal status: INITIAL  4.  Pt will easily reach to floor to pick up an ADL item with indep Baseline: Eval: supv to pick up pen from floor (increased time, effort, wide BOS) Goal status: INITIAL  5.  Pt will be modified indep to negotiate flight of steps with use of handrail. Baseline: Pt avoids 2nd floor of his home Goal status: INITIAL   ASSESSMENT:  CLINICAL IMPRESSION: Good tolerance to 6 MWT this date; 1,302 ft without seated or standing rest break.  Minimal arm swing noted and decreased foot clearance.  Initiated maximal daily exercises this date.  Pt tolerated exercises well with rest breaks.   Pt required max vc and max tactile cues to shape correct movements.  Difficulty coordinating arm swing with rock and reach.  Pt reported today's session 7/10 on perceived Bigness Effort Scale (very hard- a whole lot of effort).  Pt will continue to benefit from skilled OT with participation in [place] program to increase amplitude of movements and kinesthetic awareness in order to maximize balance, endurance, and strength for ADLs and mobility.  PERFORMANCE DEFICITS: in functional skills including IADLs, coordination, dexterity, proprioception, strength, flexibility, Fine  motor control, Gross motor control, mobility, balance, body mechanics, endurance, decreased knowledge of use of DME, and UE functional use, cognitive skills including memory, safety awareness, and understand, and psychosocial skills including habits and routines and behaviors.   IMPAIRMENTS: are limiting patient from IADLs, rest and sleep, and leisure.   CO-MORBIDITIES: may have co-morbidities  that affects occupational performance. Patient will benefit from skilled OT to address above impairments and improve overall function.  MODIFICATION OR ASSISTANCE TO COMPLETE EVALUATION: No modification of tasks or assist necessary to complete an evaluation.  OT OCCUPATIONAL PROFILE AND HISTORY: Problem focused assessment: Including review of records relating to presenting problem.  CLINICAL DECISION MAKING: Moderate - several treatment options, min-mod task modification necessary  REHAB POTENTIAL: Good  EVALUATION COMPLEXITY: Moderate    PLAN:  OT FREQUENCY: eval on 10/15/21, will begin 4x/week on 10/20/22  OT DURATION: 4 weeks  PLANNED INTERVENTIONS: self care/ADL training, therapeutic exercise, therapeutic activity, neuromuscular re-education, manual therapy, gait training, balance training, stair training, functional mobility training, patient/family education, cognitive remediation/compensation, energy conservation, coping  strategies training, and DME and/or AE instructions  RECOMMENDED OTHER SERVICES: N/A  CONSULTED AND AGREED WITH PLAN OF CARE: Patient and family member/caregiver  PLAN FOR NEXT SESSION: initiate Maximal Daily Exercises  Leta Speller, MS, OTR/L  Darleene Cleaver, OT 10/21/2022, 8:39 AM

## 2022-10-22 ENCOUNTER — Ambulatory Visit: Payer: Medicare Other

## 2022-10-22 DIAGNOSIS — R2689 Other abnormalities of gait and mobility: Secondary | ICD-10-CM

## 2022-10-22 DIAGNOSIS — R278 Other lack of coordination: Secondary | ICD-10-CM | POA: Diagnosis not present

## 2022-10-22 DIAGNOSIS — M6281 Muscle weakness (generalized): Secondary | ICD-10-CM

## 2022-10-22 NOTE — Therapy (Signed)
OUTPATIENT OCCUPATIONAL THERAPY LSVT BIG NEURO TREATMENT  Patient Name: Carlos Morrison MRN: [id number] DOB:1941/05/10, 82 y.o., male Today's Date: 10/23/2022  PCP: Dr. Tracie Harrier REFERRING PROVIDER: Dr. Jennings Books  END OF SESSION:  OT End of Session - 10/23/22 0818     Visit Number 4    Number of Visits 17    Date for OT Re-Evaluation 11/13/22    Authorization Time Period Reporting period beginning 10/16/22    Progress Note Due on Visit 10    OT Start Time 1300    OT Stop Time 1400    OT Time Calculation (min) 60 min    Equipment Utilized During Treatment none    Activity Tolerance Patient tolerated treatment well    Behavior During Therapy WFL for tasks assessed/performed             Past Medical History:  Diagnosis Date   Arthritis    Bilateral knees   GERD (gastroesophageal reflux disease)    Hyperlipidemia    Past Surgical History:  Procedure Laterality Date   CATARACT EXTRACTION W/PHACO Left 03/30/2018   Procedure: CATARACT EXTRACTION PHACO AND INTRAOCULAR LENS PLACEMENT ([place])  LEFT;  Surgeon: Leandrew Koyanagi, MD;  Location: [place];  Service: Ophthalmology;  Laterality: Left;   CATARACT EXTRACTION W/PHACO Right 01/29/2021   Procedure: CATARACT EXTRACTION PHACO AND INTRAOCULAR LENS PLACEMENT ([place]) RIGHT;  Surgeon: Leandrew Koyanagi, MD;  Location: [place];  Service: Ophthalmology;  Laterality: Right;  3.24 1:03.0 5.18%   COLONOSCOPY  2011   Dr. Bobbye [place]   COLONOSCOPY N/A 05/01/2015   Procedure: COLONOSCOPY;  Surgeon: Robert Bellow, MD;  Location: [facility] ENDOSCOPY;  Service: Endoscopy;  Laterality: N/A;   GANGLION CYST EXCISION Left 08/2006   Patient Active Problem List   Diagnosis Date Noted   CN (constipation) 03/19/2015    ONSET DATE: 2 years  REFERRING DIAG: Parkinson's Disease  THERAPY DIAG:  Muscle weakness (generalized)  Other lack of coordination  Other abnormalities of gait and mobility  Rationale for  Evaluation and Treatment: Rehabilitation  SUBJECTIVE:  SUBJECTIVE STATEMENT: Pt reports L ankle was a little sore last night but doing fine today with no pain. Pt accompanied by: self and significant other; spouse, "Carlos Morrison"  PERTINENT HISTORY: Per note from Dr. 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  Parkinson's Disease in a patient with unilateral left hand resting tremor, loss of sense of smell, imbalance, dream enactment, constipation, shuffling gait, short steps, decreased arm swing (R > L), slowness of the movements, feeling of stiffness: Patient reports having a left sided tremor and imbalance since about 2021. He reports it is hard for him to stand for a long time. Patient reports loss of sense of smell in about 2005 and constipation since about 2015 (Miralax and dulcolax). Patient also reports pulling sensation to the left. Patient reports he has always been soft-spoken. Patient reports no family history of Parkinson's disease.  At initial evaluation [name] denied decreased volume of speech, drooling, difficulty swallowing, decreased facial expressions, small hand writing, hunched forward posture, transient visual hallucination, depression, anxiety, urinary urgency, sexual dysfunction, drop in blood pressure with change in posture etc.   PRECAUTIONS: Fall  WEIGHT BEARING RESTRICTIONS: No  PAIN:  Are you having pain? No  FALLS: Has patient fallen in last 6 months? No  LIVING ENVIRONMENT: Lives with: lives with their family, spouse and daughter Lives in:  2 level home Stairs: Yes: Internal: 1 flight steps; on left going up and External: 2 steps; on left going up Has following equipment at home: None  PLOF: Independent  PATIENT GOALS: Improve balance  OBJECTIVE:   HAND DOMINANCE: Left  ADLs: Overall ADLs: indep Transfers/ambulation related to ADLs: indep Eating: able to cut own  food Grooming: indep UB Dressing: indep LB Dressing: indep Toileting: modified indep with elevated toilet seat Bathing: indep Tub Shower transfers: indep with walk in shower Equipment: none  IADLs: Shopping: spouse manages  Light housekeeping: pt participates in light housekeeping, sweeping, vacuuming Meal Prep: spouse manages at main meals at baseline; pt can make his tea or coffee  Community mobility: fatigues with community mobility  Medication management: pt/spouse report pt is indep with medications  Financial management: pt and spouse manage together  Handwriting: 100% legible and Increased time  MOBILITY STATUS:  Shuffling of gait; pt endorses some occasional freezing, decreased arm swing bilaterally but worse on the R  POSTURE COMMENTS:  rounded shoulders Sitting balance: Moves/returns truncal midpoint >2 inches in all planes  ACTIVITY TOLERANCE: Activity tolerance: Pt reports fatigue with community mobility.   FUNCTIONAL OUTCOME MEASURES: FOTO: 51; predicted 62  UPPER EXTREMITY ROM:  BUEs WFL  UPPER EXTREMITY MMT:   BUEs 5/5  HAND FUNCTION: Grip strength: Right: 53 lbs; Left: 40 lbs, Lateral pinch: Right: 20 lbs, Left: 21 lbs, and 3 point pinch: Right: 14 lbs, Left: 14 lbs  COORDINATION: 9 Hole Peg test: Right: 42 sec; Left: 37 sec  5x STS: 15 sec (>16 sec is fall risk) BERG: 49/56 (low fall risk); difficulty with tandem stance (unable to hold heel to toe), forward reach (9"), single leg stance (R leg 3 sec), and turning to the R 360* (5 sec)  6 MWT: Will plan to complete next session  SENSATION: To be further assessed  MUSCLE TONE: Pt endorses some general stiffness; no rigidity noted   COGNITION: Overall cognitive status: decreased short term memory, increased time for processing information (increased time for 1 and 2 step commands)  VISION: Subjective report: wears glasses all the time, bifocals, hx of cataracts removed both sides   PERCEPTION:  WFL  PRAXIS: Impaired: Motor planning; FOGQ: 5/24 (minimal freezing of gait)  OBSERVATIONS: Noted shuffling gait, resting LUE tremor, decreased arm swing bilaterally but R>L  TODAY'S TREATMENT:                                                                                                                              DATE: 10/21/22 LSVT: Self Care: Pt copied 1 short sentence on lined paper with standard pen; goal to prevent word crowding.  OT provided sentence with ample spacing (1 finger breadth) between each word.  Pt copied sentence 3 times with good duplication of therapist's word spacing.  Pt practiced threading zipper on coat x5 reps.  Pt required tactile cues for aligning zipper for successful threading; extra time needed and vc  for technique.  Neuro re-ed: Patient seen for LSVT Daily Session Maximal Daily Exercises for facilitation/coordination of movement Maximum Sustained Movements are designed to rescale the amplitude of movement output for generalization to daily functional activities. Performed as follows for 1 set of 10 repetitions each multi-directional sustained movements: 1) Floor to ceiling  2) Side to side multidirectional   Repetitive movements performed in standing and are designed to provide retraining effort needed for sustained muscle activation in tasks. Performed as follows for 1 set of 10 repetitions each of multi-directional repetitive movements: 3) Step and reach forward step 4) Step and reach sideways step 5) Step and reach backwards step 6) Rock and reach forward/backward  7) Rock and reach sideways   Practiced bilat arm swing with target to touch wall with each posterior arm swing, and to touch therapist's hand with each anterior arm swing; 3 sets 10 reps with max tactile cues to elicit correct alternating arm movements and swing height.  Standing marches completed with focus on foot clearance.  Pt attempted to tap a cone with each march without knocking  cone over.  Able to complete 1 set of 10 marches with min guard and vc for WS without knocking down cones.  Pt made 5 other attempts to complete 10 reps without knocking down cones but knocked down cones between 5 and 9 reps each time.  Functional Component Task: Sit to stand BIG functional component task with supervision 5 reps (completed today with initial min vc for full erect standing posture with each rep) 2. Threading zipper: 5 reps, extra time, tactile and vc for technique 3. Picking an object up from floor: pt picked up 5 cones from floor.  Cones placed in a circle to elicit directional changes from pt. Wide BOS, supv only.   4. Transferring from low chair 5. TBD  Hierarchy task: Handwriting: copied 1 sentence x3, see above. 2. Stair negotiation (up/down 1 flight with hand rail): pt practiced negotiating up/down 4 steps with bilat hand rails x5 reps, extra time and close supv.  Pt was able to utilize 1 hand rail for 2 of the 5 reps, but extra time required and pt used a "step to" pattern rather than alternating feet with each step.  Patient given daily carryover task to complete: handwriting, sit to stand without UE support, standing marches  BIG ambulation: 150 ft x 3 with focus on big arm swing forward and backward, intermittent cues for increased foot clearance.  Pt required max verbal and visual cues to increase posterior arm swing bilaterally.  PATIENT EDUCATION: Education details: maximal daily exercises Person educated: Patient and Spouse Education method: Explanation, vc, tactile cues, demo, written handout Education comprehension: verbalized understanding, further training needed  HOME EXERCISE PROGRAM: Maximal daily exercises 2x daily   GOALS: Goals reviewed with patient? yes  SHORT TERM GOALS: Target date: 10/30/22 (2 weeks)  Pt will perform maximal daily exercises with written handouts and mod vc from spouse. Baseline: Will initiate next session Goal status:  INITIAL  2.  Pt will complete 360 turn test R/L in 4 sec or less to decrease fall risk with directional changes. Baseline: R 5 sec, L 4 sec Goal status: INITIAL   LONG TERM GOALS: Target date: 11/13/22 (4 weeks)  Pt will increase FOTO score by 5 or more points to indicate clinically relevant improvement in daily tasks. Baseline: Eval: 51; predicted 62 Goal status: INITIAL  2.  Pt will demo increased tolerance for community mobility as noted by increase  in 6 MWT by 100 ft. Baseline: Eval NT d/t time constraints: To be performed next session; 10/20/22: 1,302 ft Goal status: INITIAL  3.  Pt will demo increased balance with ADLs and mobility as noted by increase in BERG balance score by 3 points) Baseline: Eval: 49/56 Goal status: INITIAL  4.  Pt will easily reach to floor to pick up an ADL item with indep Baseline: Eval: supv to pick up pen from floor (increased time, effort, wide BOS) Goal status: INITIAL  5.  Pt will be modified indep to negotiate flight of steps with use of handrail. Baseline: Pt avoids 2nd floor of his home Goal status: INITIAL   ASSESSMENT:  CLINICAL IMPRESSION: Good tolerance to maximal daily exercises this date with occasional short 2 min rest breaks.  Pt continues to demonstrate improved sitting posture today during floor to ceiling and side to side reach without posterior lean.  Pt required mod tactile cues to shape correct movements.  Pt required max tactile cues for weight shift at hips to initiate rocking without the arm swing, then max tactile cues to elicit arm swing while rocking d/t difficulty coordinating arm swing with rock and reach exercise.  Focused today on posterior arm swing and foot clearance during big walking.  Pt used wall as a tactile cue to increase posterior arm swing, and tapping cones to increase foot clearance with standing marches.   Pt ambulated 150 ft x3 with mod verbal and visual cues for increased foot clearance and posterior arm  swing.  Pt will add handwriting, threading zipper, and picking up items off the floor for additional carry over tasks.  Pt will continue to benefit from skilled OT with participation in LSVT BIG program to increase amplitude of movements and kinesthetic awareness in order to maximize balance, endurance, and strength for ADLs and mobility.  PERFORMANCE DEFICITS: in functional skills including IADLs, coordination, dexterity, proprioception, strength, flexibility, Fine motor control, Gross motor control, mobility, balance, body mechanics, endurance, decreased knowledge of use of DME, and UE functional use, cognitive skills including memory, safety awareness, and understand, and psychosocial skills including habits and routines and behaviors.   IMPAIRMENTS: are limiting patient from IADLs, rest and sleep, and leisure.   CO-MORBIDITIES: may have co-morbidities  that affects occupational performance. Patient will benefit from skilled OT to address above impairments and improve overall function.  MODIFICATION OR ASSISTANCE TO COMPLETE EVALUATION: No modification of tasks or assist necessary to complete an evaluation.  OT OCCUPATIONAL PROFILE AND HISTORY: Problem focused assessment: Including review of records relating to presenting problem.  CLINICAL DECISION MAKING: Moderate - several treatment options, min-mod task modification necessary  REHAB POTENTIAL: Good  EVALUATION COMPLEXITY: Moderate    PLAN:  OT FREQUENCY: eval on 10/15/21, will begin 4x/week on 10/20/22  OT DURATION: 4 weeks  PLANNED INTERVENTIONS: self care/ADL training, therapeutic exercise, therapeutic activity, neuromuscular re-education, manual therapy, gait training, balance training, stair training, functional mobility training, patient/family education, cognitive remediation/compensation, energy conservation, coping strategies training, and DME and/or AE instructions  RECOMMENDED OTHER SERVICES: N/A  CONSULTED AND AGREED WITH  PLAN OF CARE: Patient and family member/caregiver  PLAN FOR NEXT SESSION: initiate Maximal Daily Exercises  Danelle Earthly, MS, OTR/L  [employer] Dials, OT 10/23/2022, 8:19 AM

## 2022-10-22 NOTE — Therapy (Signed)
OUTPATIENT OCCUPATIONAL THERAPY LSVT BIG NEURO TREATMENT  Patient Name: Carlos Morrison MRN: [id number] DOB:Feb 28, 1941, 82 y.o., male Today's Date: 10/22/2022  PCP: Dr. Tracie Harrier REFERRING PROVIDER: Dr. Jennings Books  END OF SESSION:  OT End of Session - 10/22/22 0830     Visit Number 3    Number of Visits 17    Date for OT Re-Evaluation 11/13/22    Authorization Time Period Reporting period beginning 10/16/22    Progress Note Due on Visit 10    OT Start Time 1300    OT Stop Time 1400    OT Time Calculation (min) 60 min    Equipment Utilized During Treatment none    Activity Tolerance Patient tolerated treatment well    Behavior During Therapy WFL for tasks assessed/performed             Past Medical History:  Diagnosis Date   Arthritis    Bilateral knees   GERD (gastroesophageal reflux disease)    Hyperlipidemia    Past Surgical History:  Procedure Laterality Date   CATARACT EXTRACTION W/PHACO Left 03/30/2018   Procedure: CATARACT EXTRACTION PHACO AND INTRAOCULAR LENS PLACEMENT ([place])  LEFT;  Surgeon: Leandrew Koyanagi, MD;  Location: [place];  Service: Ophthalmology;  Laterality: Left;   CATARACT EXTRACTION W/PHACO Right 01/29/2021   Procedure: CATARACT EXTRACTION PHACO AND INTRAOCULAR LENS PLACEMENT ([place]) RIGHT;  Surgeon: Leandrew Koyanagi, MD;  Location: [place];  Service: Ophthalmology;  Laterality: Right;  3.24 1:03.0 5.18%   COLONOSCOPY  2011   Dr. Bobbye [place]   COLONOSCOPY N/A 05/01/2015   Procedure: COLONOSCOPY;  Surgeon: Robert Bellow, MD;  Location: [facility] END[facility]COPY;  Service: Endoscopy;  Laterality: N/A;   GANGLION CYST EXCISION Left 08/2006   Patient Active Problem List   Diagnosis Date Noted   CN (constipation) 03/19/2015    ONSET DATE: 2 years  REFERRING DIAG: Parkinson's Disease  THERAPY DIAG:  Muscle weakness (generalized)  Other lack of coordination  Other abnormalities of gait and mobility  Rationale for  Evaluation and Treatment: Rehabilitation  SUBJECTIVE:  SUBJECTIVE STATEMENT: Pt reports being very tired following last tx session, and still feeling tired today. Pt accompanied by: self and significant other; spouse, "[name]"  PERTINENT HISTORY: Per note from Dr. 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  Parkinson's Disease in a patient with unilateral left hand resting tremor, loss of sense of smell, imbalance, dream enactment, constipation, shuffling gait, short steps, decreased arm swing (R > L), slowness of the movements, feeling of stiffness: Patient reports having a left sided tremor and imbalance since about 2021. He reports it is hard for him to stand for a long time. Patient reports loss of sense of smell in about 2005 and constipation since about 2015 (Miralax and dulcolax). Patient also reports pulling sensation to the left. Patient reports he has always been soft-spoken. Patient reports no family history of Parkinson's disease.  At initial evaluation Carlos Morrison denied decreased volume of speech, drooling, difficulty swallowing, decreased facial expressions, small hand writing, hunched forward posture, transient visual hallucination, depression, anxiety, urinary urgency, sexual dysfunction, drop in blood pressure with change in posture etc.   PRECAUTIONS: Fall  WEIGHT BEARING RESTRICTIONS: No  PAIN:  Are you having pain? No  FALLS: Has patient fallen in last 6 months? No  LIVING ENVIRONMENT: Lives with: lives with their family, spouse and daughter Lives in: 2 level home  Stairs: Yes: Internal: 1 flight steps; on left going up and External: 2 steps; on left going up Has following equipment at home: None  PLOF: Independent  PATIENT GOALS: Improve balance  OBJECTIVE:   HAND DOMINANCE: Left  ADLs: Overall ADLs: indep Transfers/ambulation related to ADLs: indep Eating: able to cut own  food Grooming: indep UB Dressing: indep LB Dressing: indep Toileting: modified indep with elevated toilet seat Bathing: indep Tub Shower transfers: indep with walk in shower Equipment: none  IADLs: Shopping: spouse manages  Light housekeeping: pt participates in light housekeeping, sweeping, vacuuming Meal Prep: spouse manages at main meals at baseline; pt can make his tea or coffee  Community mobility: fatigues with community mobility  Medication management: pt/spouse report pt is indep with medications  Financial management: pt and spouse manage together  Handwriting: 100% legible and Increased time  MOBILITY STATUS:  Shuffling of gait; pt endorses some occasional freezing, decreased arm swing bilaterally but worse on the R  POSTURE COMMENTS:  rounded shoulders Sitting balance: Moves/returns truncal midpoint >2 inches in all planes  ACTIVITY TOLERANCE: Activity tolerance: Pt reports fatigue with community mobility.   FUNCTIONAL OUTCOME MEASURES: FOTO: 51; predicted 62  UPPER EXTREMITY ROM:  BUEs WFL  UPPER EXTREMITY MMT:   BUEs 5/5  HAND FUNCTION: Grip strength: Right: 53 lbs; Left: 40 lbs, Lateral pinch: Right: 20 lbs, Left: 21 lbs, and 3 point pinch: Right: 14 lbs, Left: 14 lbs  COORDINATION: 9 Hole Peg test: Right: 42 sec; Left: 37 sec  5x STS: 15 sec (>16 sec is fall risk) BERG: 49/56 (low fall risk); difficulty with tandem stance (unable to hold heel to toe), forward reach (9"), single leg stance (R leg 3 sec), and turning to the R 360* (5 sec)  6 MWT: Will plan to complete next session  SENSATION: To be further assessed  MUSCLE TONE: Pt endorses some general stiffness; no rigidity noted   COGNITION: Overall cognitive status: decreased short term memory, increased time for processing information (increased time for 1 and 2 step commands)  VISION: Subjective report: wears glasses all the time, bifocals, hx of cataracts removed both sides   PERCEPTION:  WFL  PRAXIS: Impaired: Motor planning; FOGQ: 5/24 (minimal freezing of gait)  OBSERVATIONS: Noted shuffling gait, resting LUE tremor, decreased arm swing bilaterally but R>L  TODAY'S TREATMENT:                                                                                                                              DATE: 10/21/22 LSVT: Self Care: Focus on handwriting with standard pen and lined paper.  Pt copied 3 sentences, noting appropriate word spacing and letter height within the first sentence.  Words began to crowd within the subsequent sentences, but letter height remained appropriate.  Vc provided to increase attention to word spacing within a 4th copied sentence, with slight improvement.  Encouraged pt practice writing sentences at home keeping word spacing in mind.  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   Neuro re-ed: Patient seen for LSVT Daily Session Maximal Daily Exercises for facilitation/coordination of movement Maximum Sustained Movements are designed to rescale the amplitude of movement output for generalization to daily functional activities. Performed as follows for 1 set of 10 repetitions each multi-directional sustained movements: 1) Floor to ceiling  2) Side to side multidirectional   Repetitive movements performed in standing and are designed to provide retraining effort needed for sustained muscle activation in tasks. Performed as follows for 1 set of 10 repetitions each of multi-directional repetitive movements: 3) Step and reach forward step 4) Step and reach sideways step 5) Step and reach backwards step 6) Rock and reach forward/backward  7) Rock and reach sideways   Practiced bilat arm swing with target to touch wall with each posterior arm swing, and to touch therapist's hand with each anterior arm swing; 3 sets 10 reps with max tactile cues to elicit correct alternating arm movements and swing  height.  Functional Component Task: Sit to stand BIG functional component task with supervision 5 reps (completed today with vc for full erect standing posture with each rep) 2. Threading zipper: 5 reps, extra time 3. Picking an object up from floor: pt picked up phone from floor x5 reps.  Last 3 reps increased challenge to place phone on floor behind pt to practice turning in place before reaching to the floor.   4. Transferring from low chair 5. TBD  Hierarchy task: Handwriting: copied 5 sentences, word spacing appropriate for 2 of 5 sentences, otherwise crowded  2. Stair negotiation (up/down 1 flight with hand rail): pt practiced negotiating up/down 4 steps with bilat hand rails x5 reps, extra time and close supv.    Patient given daily carryover task to complete: handwriting, sit to stand without UE support  BIG ambulation: 50 ft x5 with focus on big arm swing forward and backward.  Pt required max verbal and visual cues to increase posterior arm swing bilaterally.  PATIENT EDUCATION: Education details: maximal daily exercises Person educated: Patient and Spouse Education method: Explanation, vc, tactile cues, demo, written handout Education comprehension: verbalized understanding, further training needed  HOME EXERCISE PROGRAM: Maximal daily exercises 2x daily   GOALS: Goals reviewed with patient? yes  SHORT TERM GOALS: Target date: 10/30/22 (2 weeks)  Pt will perform maximal daily exercises with written handouts and mod vc from spouse. Baseline: Will initiate next session Goal status: INITIAL  2.  Pt will complete 360 turn test R/L in 4 sec or less to decrease fall risk with directional changes. Baseline: R 5 sec, L 4 sec Goal status: INITIAL   LONG TERM GOALS: Target date: 11/13/22 (4 weeks)  Pt will increase FOTO score by 5 or more points to indicate clinically relevant improvement in daily tasks. Baseline: Eval: 51; predicted 62 Goal status: INITIAL  2.  Pt will  demo increased tolerance for community mobility as noted by increase in 6 MWT by 100 ft. Baseline: Eval NT d/t time constraints: To be performed next session; 10/20/22: 1,302 ft Goal status: INITIAL  3.  Pt will demo increased balance with ADLs and mobility as noted by increase in BERG balance score by 3 points) Baseline: Eval: 49/56 Goal status: INITIAL  4.  Pt will easily reach to floor to pick up an ADL item with indep Baseline: Eval: supv to pick up pen from floor (increased time, effort, wide BOS) Goal status:  INITIAL  5.  Pt will be modified indep to negotiate flight of steps with use of handrail. Baseline: Pt avoids 2nd floor of his home Goal status: INITIAL   ASSESSMENT:  CLINICAL IMPRESSION: Pt reports he was too tired to attempt his maximal daily exercises for a 2nd time yesterday.  Good tolerance to maximal daily exercises this date with occasional short 2 min rest breaks.  Pt demonstrated improved sitting posture today during floor to ceiling and side to side reach without posterior lean.  Pt required max vc and max tactile cues to shape correct movements.  Pt required max tactile cues for weight shift at hips to initiate rocking without the arm swing, then max tactile cues to elicit arm swing while rocking d/t difficulty coordinating arm swing with rock and reach exercise.  Pt plans to start handwriting as a take home task with focus on increasing word spacing within his sentences.  Pt tolerated reaching to floor and 4 step stair negotiation today without reports of knee pain.  Wide BOS to pick up an item off the floor.  Pt will continue to benefit from skilled OT with participation in [place] program to increase amplitude of movements and kinesthetic awareness in order to maximize balance, endurance, and strength for ADLs and mobility.  PERFORMANCE DEFICITS: in functional skills including IADLs, coordination, dexterity, proprioception, strength, flexibility, Fine motor control,  Gross motor control, mobility, balance, body mechanics, endurance, decreased knowledge of use of DME, and UE functional use, cognitive skills including memory, safety awareness, and understand, and psychosocial skills including habits and routines and behaviors.   IMPAIRMENTS: are limiting patient from IADLs, rest and sleep, and leisure.   CO-MORBIDITIES: may have co-morbidities  that affects occupational performance. Patient will benefit from skilled OT to address above impairments and improve overall function.  MODIFICATION OR ASSISTANCE TO COMPLETE EVALUATION: No modification of tasks or assist necessary to complete an evaluation.  OT OCCUPATIONAL PROFILE AND HISTORY: Problem focused assessment: Including review of records relating to presenting problem.  CLINICAL DECISION MAKING: Moderate - several treatment options, min-mod task modification necessary  REHAB POTENTIAL: Good  EVALUATION COMPLEXITY: Moderate    PLAN:  OT FREQUENCY: eval on 10/15/21, will begin 4x/week on 10/20/22  OT DURATION: 4 weeks  PLANNED INTERVENTIONS: self care/ADL training, therapeutic exercise, therapeutic activity, neuromuscular re-education, manual therapy, gait training, balance training, stair training, functional mobility training, patient/family education, cognitive remediation/compensation, energy conservation, coping strategies training, and DME and/or AE instructions  RECOMMENDED OTHER SERVICES: N/A  CONSULTED AND AGREED WITH PLAN OF CARE: Patient and family member/caregiver  PLAN FOR NEXT SESSION: initiate Maximal Daily Exercises  Leta Speller, MS, OTR/L  Darleene Cleaver, OT 10/22/2022, 8:32 AM

## 2022-10-23 ENCOUNTER — Ambulatory Visit: Payer: Medicare Other

## 2022-10-23 DIAGNOSIS — R278 Other lack of coordination: Secondary | ICD-10-CM

## 2022-10-23 DIAGNOSIS — M6281 Muscle weakness (generalized): Secondary | ICD-10-CM

## 2022-10-23 DIAGNOSIS — R2689 Other abnormalities of gait and mobility: Secondary | ICD-10-CM

## 2022-10-26 ENCOUNTER — Ambulatory Visit: Payer: Medicare Other

## 2022-10-26 DIAGNOSIS — R278 Other lack of coordination: Secondary | ICD-10-CM | POA: Diagnosis not present

## 2022-10-26 DIAGNOSIS — M6281 Muscle weakness (generalized): Secondary | ICD-10-CM

## 2022-10-26 DIAGNOSIS — R2689 Other abnormalities of gait and mobility: Secondary | ICD-10-CM

## 2022-10-26 NOTE — Therapy (Signed)
OUTPATIENT OCCUPATIONAL THERAPY LSVT BIG NEURO TREATMENT  Patient Name: Carlos Morrison MRN: [id number] DOB:02-19-1941, 82 y.o., male Today's Date: 10/26/2022  PCP: Dr. Barbette Reichmann REFERRING PROVIDER: Dr. Cristopher Peru  END OF SESSION:  OT End of Session - 10/26/22 1035     Visit Number 5    Number of Visits 17    Date for OT Re-Evaluation 11/13/22    Authorization Time Period Reporting period beginning 10/16/22    Progress Note Due on Visit 10    OT Start Time 1100    OT Stop Time 1200    OT Time Calculation (min) 60 min    Equipment Utilized During Treatment none    Activity Tolerance Patient tolerated treatment well    Behavior During Therapy WFL for tasks assessed/performed             Past Medical History:  Diagnosis Date   Arthritis    Bilateral knees   GERD (gastroesophageal reflux disease)    Hyperlipidemia    Past Surgical History:  Procedure Laterality Date   CATARACT EXTRACTION W/PHACO Left 03/30/2018   Procedure: CATARACT EXTRACTION PHACO AND INTRAOCULAR LENS PLACEMENT (IOC)  LEFT;  Surgeon: Lockie Mola, MD;  Location: [facility];  Service: Ophthalmology;  Laterality: Left;   CATARACT EXTRACTION W/PHACO Right 01/29/2021   Procedure: CATARACT EXTRACTION PHACO AND INTRAOCULAR LENS PLACEMENT (IOC) RIGHT;  Surgeon: Lockie Mola, MD;  Location: [facility];  Service: Ophthalmology;  Laterality: Right;  3.24 1:03.0 5.18%   COLONOSCOPY  2011   Dr. Kizzie Ide   COLONOSCOPY N/A 05/01/2015   Procedure: COLONOSCOPY;  Surgeon: Earline Mayotte, MD;  Location: [facility];  Service: Endoscopy;  Laterality: N/A;   GANGLION CYST EXCISION Left 08/2006   Patient Active Problem List   Diagnosis Date Noted   CN (constipation) 03/19/2015    ONSET DATE: 2 years  REFERRING DIAG: Parkinson's Disease  THERAPY DIAG:  Muscle weakness (generalized)  Other lack of coordination  Other abnormalities of gait and mobility  Rationale for  Evaluation and Treatment: Rehabilitation  SUBJECTIVE:  SUBJECTIVE STATEMENT: Pt reports doing well today.  Eager to rest this weekend. Pt accompanied by: self and significant other; spouse, "[name]"  PERTINENT HISTORY: Per note from Dr. Sherryll Burger on 09/22/22 [name] is a left handed 82 y.o. 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  Parkinson's Disease in a patient with unilateral left hand resting tremor, loss of sense of smell, imbalance, dream enactment, constipation, shuffling gait, short steps, decreased arm swing (R > L), slowness of the movements, feeling of stiffness: Patient reports having a left sided tremor and imbalance since about 2021. He reports it is hard for him to stand for a long time. Patient reports loss of sense of smell in about 2005 and constipation since about 2015 (Miralax and dulcolax). Patient also reports pulling sensation to the left. Patient reports he has always been soft-spoken. Patient reports no family history of Parkinson's disease.  At initial evaluation [name] denied decreased volume of speech, drooling, difficulty swallowing, decreased facial expressions, small hand writing, hunched forward posture, transient visual hallucination, depression, anxiety, urinary urgency, sexual dysfunction, drop in blood pressure with change in posture etc.   PRECAUTIONS: Fall  WEIGHT BEARING RESTRICTIONS: No  PAIN:  Are you having pain? No  FALLS: Has patient fallen in last 6 months? No  LIVING ENVIRONMENT: Lives with: lives with their family, spouse and daughter Lives in: 2 level home Stairs: Yes: Internal:  1 flight steps; on left going up and External: 2 steps; on left going up Has following equipment at home: None  PLOF: Independent  PATIENT GOALS: Improve balance  OBJECTIVE:   HAND DOMINANCE: Left  ADLs: Overall ADLs: indep Transfers/ambulation related to ADLs: indep Eating: able to cut own food Grooming: indep UB  Dressing: indep LB Dressing: indep Toileting: modified indep with elevated toilet seat Bathing: indep Tub Shower transfers: indep with walk in shower Equipment: none  IADLs: Shopping: spouse manages  Light housekeeping: pt participates in light housekeeping, sweeping, vacuuming Meal Prep: spouse manages at main meals at baseline; pt can make his tea or coffee  Community mobility: fatigues with community mobility  Medication management: pt/spouse report pt is indep with medications  Financial management: pt and spouse manage together  Handwriting: 100% legible and Increased time  MOBILITY STATUS:  Shuffling of gait; pt endorses some occasional freezing, decreased arm swing bilaterally but worse on the R  POSTURE COMMENTS:  rounded shoulders Sitting balance: Moves/returns truncal midpoint >2 inches in all planes  ACTIVITY TOLERANCE: Activity tolerance: Pt reports fatigue with community mobility.   FUNCTIONAL OUTCOME MEASURES: FOTO: 51; predicted 62  UPPER EXTREMITY ROM:  BUEs WFL  UPPER EXTREMITY MMT:   BUEs 5/5  HAND FUNCTION: Grip strength: Right: 53 lbs; Left: 40 lbs, Lateral pinch: Right: 20 lbs, Left: 21 lbs, and 3 point pinch: Right: 14 lbs, Left: 14 lbs  COORDINATION: 9 Hole Peg test: Right: 42 sec; Left: 37 sec  5x STS: 15 sec (>16 sec is fall risk) BERG: 49/56 (low fall risk); difficulty with tandem stance (unable to hold heel to toe), forward reach (9"), single leg stance (R leg 3 sec), and turning to the R 360* (5 sec)  6 MWT: Will plan to complete next session  SENSATION: To be further assessed  MUSCLE TONE: Pt endorses some general stiffness; no rigidity noted   COGNITION: Overall cognitive status: decreased short term memory, increased time for processing information (increased time for 1 and 2 step commands)  VISION: Subjective report: wears glasses all the time, bifocals, hx of cataracts removed both sides   PERCEPTION: WFL  PRAXIS: Impaired:  Motor planning; FOGQ: 5/24 (minimal freezing of gait)  OBSERVATIONS: Noted shuffling gait, resting LUE tremor, decreased arm swing bilaterally but R>L  TODAY'S TREATMENT:                                                                                                                              DATE: 10/23/22 LSVT: Self Care: Pt copied 1 short sentence on lined paper with standard pen; goal to prevent word crowding.  OT provided sentence with ample spacing (1 finger breadth) between each word.  Pt copied sentence 3 times with good duplication of therapist's word spacing, then formulated his own sentence and copied 3 times with good spacing on 2 of 3 sentences.  Pt practiced threading zipper on coat x5 reps.  Pt required min vc  for aligning zipper for successful threading; extra time needed and vc for technique.  Neuro re-ed: Patient seen for LSVT Daily Session Maximal Daily Exercises for facilitation/coordination of movement Maximum Sustained Movements are designed to rescale the amplitude of movement output for generalization to daily functional activities. Performed as follows for 1 set of 10 repetitions each multi-directional sustained movements: 1) Floor to ceiling  2) Side to side multidirectional   Repetitive movements performed in standing and are designed to provide retraining effort needed for sustained muscle activation in tasks. Performed as follows for 1 set of 10 repetitions each of multi-directional repetitive movements: 3) Step and reach forward step 4) Step and reach sideways step 5) Step and reach backwards step 6) Rock and reach forward/backward  7) Rock and reach sideways   Practiced bilat arm swing with target to touch wall with each posterior arm swing, and to touch therapist's hand with each anterior arm swing; 3 sets 10 reps with max tactile cues to elicit correct alternating arm movements and swing height.  Standing marches completed with focus on foot clearance.   Pt attempted to tap a cone with each march without knocking cone over.  Completed 3 sets of 10 cone taps with min guard without knocking down cones.  Started over only 1 time after knocking down a cone.  Functional Component Task: Sit to stand BIG functional component task with supervision 5 reps, min vc for erect standing with big arms; min vc for forward reach. Threading zipper: 5 reps, extra time, tactile and vc for technique 3. Picking an object up from floor: pt picked up 5 cones from floor.  Cones placed in a circle to elicit directional changes from pt. Wide BOS, supv only.   4. Transferring from low chair 5. TBD  Hierarchy task: Handwriting: copied 2 sentences x3, see above. 2. Stair negotiation (up/down 1 flight with hand rail): pt practiced negotiating up/down 1 step (reduced today to rest knees) with L hand rail only (to simulate stairs at home) x5 reps, extra time and close supv.   Patient given daily carryover task to complete: handwriting, sit to stand without UE support, standing marches  BIG ambulation: 150 ft x 5 with focus on big arm swing forward and backward, intermittent cues for increased foot clearance.  Pt required max verbal and visual cues to increase posterior arm swing bilaterally.  PATIENT EDUCATION: Education details: maximal daily exercises Person educated: Patient and Spouse Education method: Explanation, vc, tactile cues, demo, written handout Education comprehension: verbalized understanding, further training needed  HOME EXERCISE PROGRAM: Maximal daily exercises 2x daily   GOALS: Goals reviewed with patient? yes  SHORT TERM GOALS: Target date: 10/30/22 (2 weeks)  Pt will perform maximal daily exercises with written handouts and mod vc from spouse. Baseline: Will initiate next session Goal status: INITIAL  2.  Pt will complete 360 turn test R/L in 4 sec or less to decrease fall risk with directional changes. Baseline: R 5 sec, L 4 sec Goal status:  INITIAL   LONG TERM GOALS: Target date: 11/13/22 (4 weeks)  Pt will increase FOTO score by 5 or more points to indicate clinically relevant improvement in daily tasks. Baseline: Eval: 51; predicted 62 Goal status: INITIAL  2.  Pt will demo increased tolerance for community mobility as noted by increase in 6 MWT by 100 ft. Baseline: Eval NT d/t time constraints: To be performed next session; 10/20/22: 1,302 ft Goal status: INITIAL  3.  Pt will demo increased balance  with ADLs and mobility as noted by increase in BERG balance score by 3 points) Baseline: Eval: 49/56 Goal status: INITIAL  4.  Pt will easily reach to floor to pick up an ADL item with indep Baseline: Eval: supv to pick up pen from floor (increased time, effort, wide BOS) Goal status: INITIAL  5.  Pt will be modified indep to negotiate flight of steps with use of handrail. Baseline: Pt avoids 2nd floor of his home Goal status: INITIAL   ASSESSMENT:  CLINICAL IMPRESSION: Improving tolerance to maximal daily exercises this date with occasional short 1 min rest break.  Pt required mod tactile cues to shape correct movements.  Pt continues to require max visual and tactile cues to coordinate alternating arms with rock and reach forward/backward, and max visual and tactile cues for eliciting big arms and increased trunk rotation with rock and reach sideways.  Pt responds well to look for a target to increase trunk rotation.  Continued focus today on posterior arm swing and foot clearance during big walking.  Pt used wall as a tactile cue to increase posterior arm swing, and tapping cones to increase foot clearance with standing marches.  Pt was able to complete 3 sets 10 standing marches with cone taps without knocking down cones in 3 of 4 trials, demonstrating improved balance and foot clearance during marches.  Pt will continue to benefit from skilled OT with participation in [place] program to increase amplitude of movements and  kinesthetic awareness in order to maximize balance, endurance, and strength for ADLs and mobility.  PERFORMANCE DEFICITS: in functional skills including IADLs, coordination, dexterity, proprioception, strength, flexibility, Fine motor control, Gross motor control, mobility, balance, body mechanics, endurance, decreased knowledge of use of DME, and UE functional use, cognitive skills including memory, safety awareness, and understand, and psychosocial skills including habits and routines and behaviors.   IMPAIRMENTS: are limiting patient from IADLs, rest and sleep, and leisure.   CO-MORBIDITIES: may have co-morbidities  that affects occupational performance. Patient will benefit from skilled OT to address above impairments and improve overall function.  MODIFICATION OR ASSISTANCE TO COMPLETE EVALUATION: No modification of tasks or assist necessary to complete an evaluation.  OT OCCUPATIONAL PROFILE AND HISTORY: Problem focused assessment: Including review of records relating to presenting problem.  CLINICAL DECISION MAKING: Moderate - several treatment options, min-mod task modification necessary  REHAB POTENTIAL: Good  EVALUATION COMPLEXITY: Moderate    PLAN:  OT FREQUENCY: eval on 10/15/21, will begin 4x/week on 10/20/22  OT DURATION: 4 weeks  PLANNED INTERVENTIONS: self care/ADL training, therapeutic exercise, therapeutic activity, neuromuscular re-education, manual therapy, gait training, balance training, stair training, functional mobility training, patient/family education, cognitive remediation/compensation, energy conservation, coping strategies training, and DME and/or AE instructions  RECOMMENDED OTHER SERVICES: N/A  CONSULTED AND AGREED WITH PLAN OF CARE: Patient and family member/caregiver  PLAN FOR NEXT SESSION: initiate Maximal Daily Exercises  Leta Speller, MS, OTR/L  Darleene Cleaver, OT 10/26/2022, 10:37 AM

## 2022-10-26 NOTE — Therapy (Signed)
OUTPATIENT OCCUPATIONAL THERAPY LSVT BIG NEURO TREATMENT  Patient Name: Carlos Morrison MRN: [id number] DOB:1941/06/22, 82 y.o., male Today's Date: 10/26/2022  PCP: Dr. Barbette Reichmann REFERRING PROVIDER: Dr. Cristopher Peru  END OF SESSION:  OT End of Session - 10/26/22 1639     Visit Number 6    Number of Visits 17    Date for OT Re-Evaluation 11/13/22    Authorization Time Period Reporting period beginning 10/16/22    Progress Note Due on Visit 10    OT Start Time 1300    OT Stop Time 1400    OT Time Calculation (min) 60 min    Equipment Utilized During Treatment none    Activity Tolerance Patient tolerated treatment well    Behavior During Therapy WFL for tasks assessed/performed             Past Medical History:  Diagnosis Date   Arthritis    Bilateral knees   GERD (gastroesophageal reflux disease)    Hyperlipidemia    Past Surgical History:  Procedure Laterality Date   CATARACT EXTRACTION W/PHACO Left 03/30/2018   Procedure: CATARACT EXTRACTION PHACO AND INTRAOCULAR LENS PLACEMENT (IOC)  LEFT;  Surgeon: Lockie Mola, MD;  Location: [facility];  Service: Ophthalmology;  Laterality: Left;   CATARACT EXTRACTION W/PHACO Right 01/29/2021   Procedure: CATARACT EXTRACTION PHACO AND INTRAOCULAR LENS PLACEMENT (IOC) RIGHT;  Surgeon: Lockie Mola, MD;  Location: [facility];  Service: Ophthalmology;  Laterality: Right;  3.24 1:03.0 5.18%   COLONOSCOPY  2011   Dr. Kizzie Ide   COLONOSCOPY N/A 05/01/2015   Procedure: COLONOSCOPY;  Surgeon: Earline Mayotte, MD;  Location: [facility];  Service: Endoscopy;  Laterality: N/A;   GANGLION CYST EXCISION Left 08/2006   Patient Active Problem List   Diagnosis Date Noted   CN (constipation) 03/19/2015    ONSET DATE: 2 years  REFERRING DIAG: Parkinson's Disease  THERAPY DIAG:  Muscle weakness (generalized)  Other lack of coordination  Other abnormalities of gait and mobility  Rationale for  Evaluation and Treatment: Rehabilitation  SUBJECTIVE:  SUBJECTIVE STATEMENT: Pt reports that he did try his maximal daily exercises a couple of times over the weekend.  OT continues to encourage pt attempt exercises 2x daily to achieve max benefit.   Pt accompanied by: self and significant other; spouse, "[name]"  PERTINENT HISTORY: Per note from Dr. Sherryll Burger on 09/22/22 [name] is a left handed 82 y.o. 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  Parkinson's Disease in a patient with unilateral left hand resting tremor, loss of sense of smell, imbalance, dream enactment, constipation, shuffling gait, short steps, decreased arm swing (R > L), slowness of the movements, feeling of stiffness: Patient reports having a left sided tremor and imbalance since about 2021. He reports it is hard for him to stand for a long time. Patient reports loss of sense of smell in about 2005 and constipation since about 2015 (Miralax and dulcolax). Patient also reports pulling sensation to the left. Patient reports he has always been soft-spoken. Patient reports no family history of Parkinson's disease.  At initial evaluation [name] denied decreased volume of speech, drooling, difficulty swallowing, decreased facial expressions, small hand writing, hunched forward posture, transient visual hallucination, depression, anxiety, urinary urgency, sexual dysfunction, drop in blood pressure with change in posture etc.   PRECAUTIONS: Fall  WEIGHT BEARING RESTRICTIONS: No  PAIN:  Are you having pain? No  FALLS: Has patient fallen in last 6  months? No  LIVING ENVIRONMENT: Lives with: lives with their family, spouse and daughter Lives in: 2 level home Stairs: Yes: Internal: 1 flight steps; on left going up and External: 2 steps; on left going up Has following equipment at home: None  PLOF: Independent  PATIENT GOALS: Improve balance  OBJECTIVE:   HAND DOMINANCE:  Left  ADLs: Overall ADLs: indep Transfers/ambulation related to ADLs: indep Eating: able to cut own food Grooming: indep UB Dressing: indep LB Dressing: indep Toileting: modified indep with elevated toilet seat Bathing: indep Tub Shower transfers: indep with walk in shower Equipment: none  IADLs: Shopping: spouse manages  Light housekeeping: pt participates in light housekeeping, sweeping, vacuuming Meal Prep: spouse manages at main meals at baseline; pt can make his tea or coffee  Community mobility: fatigues with community mobility  Medication management: pt/spouse report pt is indep with medications  Financial management: pt and spouse manage together  Handwriting: 100% legible and Increased time  MOBILITY STATUS:  Shuffling of gait; pt endorses some occasional freezing, decreased arm swing bilaterally but worse on the R  POSTURE COMMENTS:  rounded shoulders Sitting balance: Moves/returns truncal midpoint >2 inches in all planes  ACTIVITY TOLERANCE: Activity tolerance: Pt reports fatigue with community mobility.   FUNCTIONAL OUTCOME MEASURES: FOTO: 51; predicted 62  UPPER EXTREMITY ROM:  BUEs WFL  UPPER EXTREMITY MMT:   BUEs 5/5  HAND FUNCTION: Grip strength: Right: 53 lbs; Left: 40 lbs, Lateral pinch: Right: 20 lbs, Left: 21 lbs, and 3 point pinch: Right: 14 lbs, Left: 14 lbs  COORDINATION: 9 Hole Peg test: Right: 42 sec; Left: 37 sec  5x STS: 15 sec (>16 sec is fall risk) BERG: 49/56 (low fall risk); difficulty with tandem stance (unable to hold heel to toe), forward reach (9"), single leg stance (R leg 3 sec), and turning to the R 360* (5 sec)  6 MWT: Will plan to complete next session  SENSATION: To be further assessed  MUSCLE TONE: Pt endorses some general stiffness; no rigidity noted   COGNITION: Overall cognitive status: decreased short term memory, increased time for processing information (increased time for 1 and 2 step  commands)  VISION: Subjective report: wears glasses all the time, bifocals, hx of cataracts removed both sides   PERCEPTION: WFL  PRAXIS: Impaired: Motor planning; FOGQ: 5/24 (minimal freezing of gait)  OBSERVATIONS: Noted shuffling gait, resting LUE tremor, decreased arm swing bilaterally but R>L  TODAY'S TREATMENT:                                                                                                                              DATE: 10/23/22 LSVT: Self Care: Pt practiced threading zipper on coat x5 reps.  Pt required min vc for aligning zipper for successful threading 1 out of 5 reps; extra time needed and vc for technique.  Neuro re-ed: Patient seen for LSVT Daily Session Maximal Daily Exercises for facilitation/coordination of movement Maximum Sustained Movements are  designed to rescale the amplitude of movement output for generalization to daily functional activities. Performed as follows for 1 set of 10 repetitions each multi-directional sustained movements: 1) Floor to ceiling  2) Side to side multidirectional   Repetitive movements performed in standing and are designed to provide retraining effort needed for sustained muscle activation in tasks. Performed as follows for 1 set of 10 repetitions each of multi-directional repetitive movements: 3) Step and reach forward step 4) Step and reach sideways step 5) Step and reach backwards step 6) Rock and reach forward/backward  7) Rock and reach sideways   Practiced bilat arm swing with target to touch wall with each posterior arm swing, and to touch therapist's hand with each anterior arm swing; 3 sets 10 reps with mod tactile cues to elicit correct alternating arm movements and swing height.  Standing marches completed with focus on foot clearance.  Pt attempted to tap a cone with each march without knocking cone over.  Completed 3 sets of 10 cone taps with supv without knocking down cones.  Started over only 1 time  after knocking down a cone on 3rd set.    Trunk rotation: Pt reached with the R arm while turning L to reach for cones on table top behind him, then performed reps to reach with L hand while turning to the R to reach for cones.  Pt required intermittent min guard for balance throughout.  Completed 3 sets 5 on each side.  Functional Component Task: Sit to stand BIG functional component task with supervision 5 reps, 1 initial verbal and visual cue for erect standing with big arms; min vc for forward reach. Threading zipper: 5 reps, extra time, min vc for technique for 1 of 5 reps 3. Picking an object up from floor: pt picked up 5 cones from floor, distant supv 4. Transferring from low chair 5. TBD  Hierarchy task: Handwriting: not completed today.  Carryover task for HEP. 2. Stair negotiation (up/down 1 flight with hand rail): pt practiced negotiating up/down 1 step (reduced today to rest knees) with L hand rail only (to simulate stairs at home) x5 reps, extra time and close supv.   Patient given daily carryover task to complete: handwriting, sit to stand without UE support, standing marches  BIG ambulation: Practiced increased foot clearance providing visual cue of cones.  Pt practiced stepping over a straight line of 5 cones x7 reps.  Pt required min guard for balance and max vc to step over and not circumduct feet around the cones.  Not yet able to add the arm swing.  Functional mobility 150 ft x5 reps with focus on big arm swing; pt required ongoing max verbal and visual cues for forward and posterior arm swing, mod vc for increased foot clearance.  Pt was unable to sustain a big arm swing and foot clearance simultaneously this date.   PATIENT EDUCATION: Education details: maximal daily exercises Person educated: Patient and Spouse Education method: Explanation, vc, tactile cues, demo, written handout Education comprehension: verbalized understanding, further training needed  HOME  EXERCISE PROGRAM: Maximal daily exercises 2x daily   GOALS: Goals reviewed with patient? yes  SHORT TERM GOALS: Target date: 10/30/22 (2 weeks)  Pt will perform maximal daily exercises with written handouts and mod vc from spouse. Baseline: Will initiate next session Goal status: INITIAL  2.  Pt will complete 360 turn test R/L in 4 sec or less to decrease fall risk with directional changes. Baseline: R 5 sec, L  4 sec Goal status: INITIAL   LONG TERM GOALS: Target date: 11/13/22 (4 weeks)  Pt will increase FOTO score by 5 or more points to indicate clinically relevant improvement in daily tasks. Baseline: Eval: 51; predicted 62 Goal status: INITIAL  2.  Pt will demo increased tolerance for community mobility as noted by increase in 6 MWT by 100 ft. Baseline: Eval NT d/t time constraints: To be performed next session; 10/20/22: 1,302 ft Goal status: INITIAL  3.  Pt will demo increased balance with ADLs and mobility as noted by increase in BERG balance score by 3 points) Baseline: Eval: 49/56 Goal status: INITIAL  4.  Pt will easily reach to floor to pick up an ADL item with indep Baseline: Eval: supv to pick up pen from floor (increased time, effort, wide BOS) Goal status: INITIAL  5.  Pt will be modified indep to negotiate flight of steps with use of handrail. Baseline: Pt avoids 2nd floor of his home Goal status: INITIAL   ASSESSMENT:  CLINICAL IMPRESSION: Progressed maximal daily exercises with dual tasking having pt to keep count for the last 5 reps of each exercise (unable) and count aloud for 10 second holds.  Pt initiated each exercise first reviewing pictures and written directions with written HEP, requiring cues to correctly initiate 25% of the exercises.  Pt was able to complete 3 sets 10 standing marches with cone taps without knocking down cones in 3 of 4 trials, demonstrating improved balance and foot clearance during marches.  Pt practiced stepping over 5 cones in a  straight line, but required min guard for balance and max vc to step over and not circumduct feet around the cones, knocking over cones frequently. Not yet able to add the arm swing when stepping over cones.  Pt continues to require ongoing max verbal and visual cues for forward and posterior arm swing, mod vc for increased foot clearance during big walking.  Pt was unable to sustain a big arm swing and sufficient foot clearance simultaneously this date.  Pt continues to report 7/10 on big effort scale with today's tx session.  Pt did appear more sluggish today, though he denied increased fatigue.  Pt will continue to benefit from skilled OT with participation in LSVT BIG program to increase amplitude of movements and kinesthetic awareness in order to maximize balance, endurance, and strength for ADLs and mobility.  PERFORMANCE DEFICITS: in functional skills including IADLs, coordination, dexterity, proprioception, strength, flexibility, Fine motor control, Gross motor control, mobility, balance, body mechanics, endurance, decreased knowledge of use of DME, and UE functional use, cognitive skills including memory, safety awareness, and understand, and psychosocial skills including habits and routines and behaviors.   IMPAIRMENTS: are limiting patient from IADLs, rest and sleep, and leisure.   CO-MORBIDITIES: may have co-morbidities  that affects occupational performance. Patient will benefit from skilled OT to address above impairments and improve overall function.  MODIFICATION OR ASSISTANCE TO COMPLETE EVALUATION: No modification of tasks or assist necessary to complete an evaluation.  OT OCCUPATIONAL PROFILE AND HISTORY: Problem focused assessment: Including review of records relating to presenting problem.  CLINICAL DECISION MAKING: Moderate - several treatment options, min-mod task modification necessary  REHAB POTENTIAL: Good  EVALUATION COMPLEXITY: Moderate    PLAN:  OT FREQUENCY: eval  on 10/15/21, will begin 4x/week on 10/20/22  OT DURATION: 4 weeks  PLANNED INTERVENTIONS: self care/ADL training, therapeutic exercise, therapeutic activity, neuromuscular re-education, manual therapy, gait training, balance training, stair training, functional mobility training, patient/family  education, cognitive remediation/compensation, energy conservation, coping strategies training, and DME and/or AE instructions  RECOMMENDED OTHER SERVICES: N/A  CONSULTED AND AGREED WITH PLAN OF CARE: Patient and family member/caregiver  PLAN FOR NEXT SESSION: initiate Maximal Daily Exercises  Leta Speller, MS, OTR/L  Darleene Cleaver, OT 10/26/2022, 4:40 PM

## 2022-10-27 ENCOUNTER — Ambulatory Visit: Payer: Medicare Other

## 2022-10-27 DIAGNOSIS — M6281 Muscle weakness (generalized): Secondary | ICD-10-CM

## 2022-10-27 DIAGNOSIS — R278 Other lack of coordination: Secondary | ICD-10-CM | POA: Diagnosis not present

## 2022-10-27 DIAGNOSIS — R2689 Other abnormalities of gait and mobility: Secondary | ICD-10-CM

## 2022-10-28 ENCOUNTER — Ambulatory Visit: Payer: Medicare Other

## 2022-10-28 DIAGNOSIS — R2689 Other abnormalities of gait and mobility: Secondary | ICD-10-CM

## 2022-10-28 DIAGNOSIS — M6281 Muscle weakness (generalized): Secondary | ICD-10-CM

## 2022-10-28 DIAGNOSIS — R278 Other lack of coordination: Secondary | ICD-10-CM | POA: Diagnosis not present

## 2022-10-28 NOTE — Therapy (Signed)
OUTPATIENT OCCUPATIONAL THERAPY LSVT BIG NEURO TREATMENT  Patient Name: Carlos Morrison MRN: [id number] DOB:Jun 13, 1941, 82 y.o., male Today's Date: 10/28/2022  PCP: Dr. Barbette Reichmann REFERRING PROVIDER: Dr. Cristopher Peru  END OF SESSION:  OT End of Session - 10/28/22 1040     Visit Number 7    Number of Visits 17    Date for OT Re-Evaluation 11/13/22    Authorization Time Period Reporting period beginning 10/16/22    Progress Note Due on Visit 10    OT Start Time 1300    OT Stop Time 1400    OT Time Calculation (min) 60 min    Equipment Utilized During Treatment none    Activity Tolerance Patient tolerated treatment well    Behavior During Therapy WFL for tasks assessed/performed             Past Medical History:  Diagnosis Date   Arthritis    Bilateral knees   GERD (gastroesophageal reflux disease)    Hyperlipidemia    Past Surgical History:  Procedure Laterality Date   CATARACT EXTRACTION W/PHACO Left 03/30/2018   Procedure: CATARACT EXTRACTION PHACO AND INTRAOCULAR LENS PLACEMENT (IOC)  LEFT;  Surgeon: Lockie Mola, MD;  Location: [facility];  Service: Ophthalmology;  Laterality: Left;   CATARACT EXTRACTION W/PHACO Right 01/29/2021   Procedure: CATARACT EXTRACTION PHACO AND INTRAOCULAR LENS PLACEMENT (IOC) RIGHT;  Surgeon: Lockie Mola, MD;  Location: [facility];  Service: Ophthalmology;  Laterality: Right;  3.24 1:03.0 5.18%   COLONOSCOPY  2011   Dr. Kizzie Ide   COLONOSCOPY N/A 05/01/2015   Procedure: COLONOSCOPY;  Surgeon: Earline Mayotte, MD;  Location: [facility];  Service: Endoscopy;  Laterality: N/A;   GANGLION CYST EXCISION Left 08/2006   Patient Active Problem List   Diagnosis Date Noted   CN (constipation) 03/19/2015    ONSET DATE: 2 years  REFERRING DIAG: Parkinson's Disease  THERAPY DIAG:  Muscle weakness (generalized)  Other lack of coordination  Other abnormalities of gait and mobility  Rationale for  Evaluation and Treatment: Rehabilitation  SUBJECTIVE:  SUBJECTIVE STATEMENT: Pt reports that he was quite tired last night and tried just a couple of his exercises. Pt accompanied by: self and significant other; spouse, "Carlos Morrison"  PERTINENT HISTORY: Per note from Dr. Sherryll Burger on 09/22/22 Carlos Morrison is a left handed 82 y.o. 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  Parkinson's Disease in a patient with unilateral left hand resting tremor, loss of sense of smell, imbalance, dream enactment, constipation, shuffling gait, short steps, decreased arm swing (R > L), slowness of the movements, feeling of stiffness: Patient reports having a left sided tremor and imbalance since about 2021. He reports it is hard for him to stand for a long time. Patient reports loss of sense of smell in about 2005 and constipation since about 2015 (Miralax and dulcolax). Patient also reports pulling sensation to the left. Patient reports he has always been soft-spoken. Patient reports no family history of Parkinson's disease.  At initial evaluation Carlos Morrison denied decreased volume of speech, drooling, difficulty swallowing, decreased facial expressions, small hand writing, hunched forward posture, transient visual hallucination, depression, anxiety, urinary urgency, sexual dysfunction, drop in blood pressure with change in posture etc.   PRECAUTIONS: Fall  WEIGHT BEARING RESTRICTIONS: No  PAIN:  Are you having pain? No  FALLS: Has patient fallen in last 6 months? No  LIVING ENVIRONMENT: Lives with: lives with their family, spouse and daughter Lives in:  2 level home Stairs: Yes: Internal: 1 flight steps; on left going up and External: 2 steps; on left going up Has following equipment at home: None  PLOF: Independent  PATIENT GOALS: Improve balance  OBJECTIVE:   HAND DOMINANCE: Left  ADLs: Overall ADLs: indep Transfers/ambulation related to ADLs: indep Eating: able to cut own  food Grooming: indep UB Dressing: indep LB Dressing: indep Toileting: modified indep with elevated toilet seat Bathing: indep Tub Shower transfers: indep with walk in shower Equipment: none  IADLs: Shopping: spouse manages  Light housekeeping: pt participates in light housekeeping, sweeping, vacuuming Meal Prep: spouse manages at main meals at baseline; pt can make his tea or coffee  Community mobility: fatigues with community mobility  Medication management: pt/spouse report pt is indep with medications  Financial management: pt and spouse manage together  Handwriting: 100% legible and Increased time  MOBILITY STATUS:  Shuffling of gait; pt endorses some occasional freezing, decreased arm swing bilaterally but worse on the R  POSTURE COMMENTS:  rounded shoulders Sitting balance: Moves/returns truncal midpoint >2 inches in all planes  ACTIVITY TOLERANCE: Activity tolerance: Pt reports fatigue with community mobility.   FUNCTIONAL OUTCOME MEASURES: FOTO: 51; predicted 62  UPPER EXTREMITY ROM:  BUEs WFL  UPPER EXTREMITY MMT:   BUEs 5/5  HAND FUNCTION: Grip strength: Right: 53 lbs; Left: 40 lbs, Lateral pinch: Right: 20 lbs, Left: 21 lbs, and 3 point pinch: Right: 14 lbs, Left: 14 lbs  COORDINATION: 9 Hole Peg test: Right: 42 sec; Left: 37 sec  5x STS: 15 sec (>16 sec is fall risk) BERG: 49/56 (low fall risk); difficulty with tandem stance (unable to hold heel to toe), forward reach (9"), single leg stance (R leg 3 sec), and turning to the R 360* (5 sec)  6 MWT: Will plan to complete next session  SENSATION: To be further assessed  MUSCLE TONE: Pt endorses some general stiffness; no rigidity noted   COGNITION: Overall cognitive status: decreased short term memory, increased time for processing information (increased time for 1 and 2 step commands)  VISION: Subjective report: wears glasses all the time, bifocals, hx of cataracts removed both sides   PERCEPTION:  WFL  PRAXIS: Impaired: Motor planning; FOGQ: 5/24 (minimal freezing of gait)  OBSERVATIONS: Noted shuffling gait, resting LUE tremor, decreased arm swing bilaterally but R>L  TODAY'S TREATMENT:                                                                                                                              DATE: 10/23/22 LSVT: Self Care: Pt practiced threading zipper on coat x5 reps.  Pt required min vc and tactile cues for aligning zipper for successful threading 1 out of 5 reps; extra time needed and vc for technique.  Pt required set up of coat and both sizes of zipper to assist with spatial orientation for first rep.  Handwriting practice: pt copied 1 sentence x3, crowding noted in 2 of  3 sentences.  Pt cued to exaggerate spacing on 3rd sentence with slight improvement.   Neuro re-ed: Patient seen for LSVT Daily Session Maximal Daily Exercises for facilitation/coordination of movement Maximum Sustained Movements are designed to rescale the amplitude of movement output for generalization to daily functional activities. Performed as follows for 1 set of 10 repetitions each multi-directional sustained movements: 1) Floor to ceiling  2) Side to side multidirectional   Repetitive movements performed in standing and are designed to provide retraining effort needed for sustained muscle activation in tasks. Performed as follows for 1 set of 10 repetitions each of multi-directional repetitive movements: 3) Step and reach forward step 4) Step and reach sideways step 5) Step and reach backwards step 6) Rock and reach forward/backward  7) Rock and reach sideways   Practiced bilat arm swing with target to touch wall with each posterior arm swing, and to touch therapist's hand with each anterior arm swing; 3 sets 10 reps with mod tactile cues to elicit correct alternating arm movements and swing height.  Standing marches completed with focus on foot clearance.  Pt attempted to tap a  cone with each march without knocking cone over.  Completed 3 sets of 10 cone taps with supv without knocking down cones.  Pt required 10 trials to complete 3 sets without knocking down cones.  Trunk rotation: Pt reached with the R arm while turning L to reach for cones on table top behind him, then performed reps to reach with L hand while turning to the R to reach for cones.  Pt required intermittent min guard for balance throughout.  Completed 3 sets of 5 on each side.  Functional Component Task: Sit to stand BIG functional component task with supervision 10 reps, 1 initial verbal and visual cue for erect standing with big arms. Threading zipper: 5 reps, extra time, min vc and tactile cues for technique for 1 of 5 reps 3. Picking an object up from floor: pt picked up 5 cones from floor, distant supv 4. Transferring from low chair 5. TBD  Hierarchy task: Handwriting: see above. 2. Stair negotiation (up/down 1 flight with hand rail): pt practiced negotiating up/down 1 step leading with R foot x5 (no hand rail, SBA), then leading with L foot x5, (min guard and use of L hand rail).    BIG ambulation: Functional mobility 150 ft x5 reps with focus on big arm swing; pt required ongoing max verbal and visual cues for forward and posterior arm swing, mod vc for increased foot clearance.  Pt was unable to sustain a big arm swing and foot clearance simultaneously this date.   PATIENT EDUCATION: Education details: maximal daily exercises Person educated: Patient and Spouse Education method: Explanation, vc, tactile cues, demo, written handout Education comprehension: verbalized understanding, further training needed  HOME EXERCISE PROGRAM: Maximal daily exercises 2x daily   GOALS: Goals reviewed with patient? yes  SHORT TERM GOALS: Target date: 10/30/22 (2 weeks)  Pt will perform maximal daily exercises with written handouts and mod vc from spouse. Baseline: Will initiate next session Goal  status: INITIAL  2.  Pt will complete 360 turn test R/L in 4 sec or less to decrease fall risk with directional changes. Baseline: R 5 sec, L 4 sec Goal status: INITIAL   LONG TERM GOALS: Target date: 11/13/22 (4 weeks)  Pt will increase FOTO score by 5 or more points to indicate clinically relevant improvement in daily tasks. Baseline: Eval: 51; predicted 62 Goal status: INITIAL  2.  Pt will demo increased tolerance for community mobility as noted by increase in 6 MWT by 100 ft. Baseline: Eval NT d/t time constraints: To be performed next session; 10/20/22: 1,302 ft Goal status: INITIAL  3.  Pt will demo increased balance with ADLs and mobility as noted by increase in BERG balance score by 3 points) Baseline: Eval: 49/56 Goal status: INITIAL  4.  Pt will easily reach to floor to pick up an ADL item with indep Baseline: Eval: supv to pick up pen from floor (increased time, effort, wide BOS) Goal status: INITIAL  5.  Pt will be modified indep to negotiate flight of steps with use of handrail. Baseline: Pt avoids 2nd floor of his home Goal status: INITIAL   ASSESSMENT:  CLINICAL IMPRESSION: Pt continues to be challenged with dual tasking when asked to keep rep count and hold count during maximal daily exercises; max vc needed.  Pt initiated each exercise first reviewing pictures and written directions with written HEP, requiring mod verbal and visual cues to correctly initiate 50% of the exercises.  Pt required 10 attempts to successfully complete 3 sets 10 standing marches with cone taps without knocking down cones.  Provided repetitions of lateral WS with tactile cues to improve the SLS required to successfully tap cone during a march without knocking over cone.  Pt continues to require ongoing max verbal and visual cues for forward and posterior arm swing, mod vc for increased foot clearance during big walking.  Pt continues to be challenged with sustaining a big arm swing and  sufficient foot clearance simultaneously.  Pt responds well to touching the wall for feedback to increase posterior arm swing.  Pt continues to report 7/10 on big effort scale with today's tx session.  Pt still sluggish today, though he denied increased fatigue.  Pt reports no medication changes and early afternoon continues to be a good time of day for pt, per his report.  Pt will continue to benefit from skilled OT with participation in [place] program to increase amplitude of movements and kinesthetic awareness in order to maximize balance, endurance, and strength for ADLs and mobility.  PERFORMANCE DEFICITS: in functional skills including IADLs, coordination, dexterity, proprioception, strength, flexibility, Fine motor control, Gross motor control, mobility, balance, body mechanics, endurance, decreased knowledge of use of DME, and UE functional use, cognitive skills including memory, safety awareness, and understand, and psychosocial skills including habits and routines and behaviors.   IMPAIRMENTS: are limiting patient from IADLs, rest and sleep, and leisure.   CO-MORBIDITIES: may have co-morbidities  that affects occupational performance. Patient will benefit from skilled OT to address above impairments and improve overall function.  MODIFICATION OR ASSISTANCE TO COMPLETE EVALUATION: No modification of tasks or assist necessary to complete an evaluation.  OT OCCUPATIONAL PROFILE AND HISTORY: Problem focused assessment: Including review of records relating to presenting problem.  CLINICAL DECISION MAKING: Moderate - several treatment options, min-mod task modification necessary  REHAB POTENTIAL: Good  EVALUATION COMPLEXITY: Moderate    PLAN:  OT FREQUENCY: eval on 10/15/21, will begin 4x/week on 10/20/22  OT DURATION: 4 weeks  PLANNED INTERVENTIONS: self care/ADL training, therapeutic exercise, therapeutic activity, neuromuscular re-education, manual therapy, gait training, balance  training, stair training, functional mobility training, patient/family education, cognitive remediation/compensation, energy conservation, coping strategies training, and DME and/or AE instructions  RECOMMENDED OTHER SERVICES: N/A  CONSULTED AND AGREED WITH PLAN OF CARE: Patient and family member/caregiver  PLAN FOR NEXT SESSION: initiate Maximal Daily Exercises  Carlos Speller, MS, OTR/L  Carlos Morrison, OT 10/28/2022, 10:41 AM

## 2022-10-29 ENCOUNTER — Ambulatory Visit: Payer: Medicare Other

## 2022-10-29 DIAGNOSIS — R278 Other lack of coordination: Secondary | ICD-10-CM

## 2022-10-29 DIAGNOSIS — M6281 Muscle weakness (generalized): Secondary | ICD-10-CM

## 2022-10-29 DIAGNOSIS — R2689 Other abnormalities of gait and mobility: Secondary | ICD-10-CM

## 2022-10-29 NOTE — Therapy (Signed)
OUTPATIENT OCCUPATIONAL THERAPY LSVT BIG NEURO TREATMENT  Patient Name: Carlos Morrison MRN: [id number] DOB:08/26/1941, 82 y.o., male Today's Date: 10/29/2022  PCP: Dr. Tracie Harrier REFERRING PROVIDER: Dr. Jennings Books  END OF SESSION:  OT End of Session - 10/29/22 0850     Visit Number 8    Number of Visits 17    Date for OT Re-Evaluation 11/13/22    Authorization Time Period Reporting period beginning 10/16/22    Progress Note Due on Visit 10    OT Start Time 1300    OT Stop Time 1400    OT Time Calculation (min) 60 min    Equipment Utilized During Treatment none    Activity Tolerance Patient tolerated treatment well    Behavior During Therapy WFL for tasks assessed/performed             Past Medical History:  Diagnosis Date   Arthritis    Bilateral knees   GERD (gastroesophageal reflux disease)    Hyperlipidemia    Past Surgical History:  Procedure Laterality Date   CATARACT EXTRACTION W/PHACO Left 03/30/2018   Procedure: CATARACT EXTRACTION PHACO AND INTRAOCULAR LENS PLACEMENT ([place])  LEFT;  Surgeon: Leandrew Koyanagi, MD;  Location: [facility];  Service: Ophthalmology;  Laterality: Left;   CATARACT EXTRACTION W/PHACO Right 01/29/2021   Procedure: CATARACT EXTRACTION PHACO AND INTRAOCULAR LENS PLACEMENT ([place]) RIGHT;  Surgeon: Leandrew Koyanagi, MD;  Location: [facility];  Service: Ophthalmology;  Laterality: Right;  3.24 1:03.0 5.18%   COLONOSCOPY  2011   Dr. Bobbye Charleston   COLONOSCOPY N/A 05/01/2015   Procedure: COLONOSCOPY;  Surgeon: Robert Bellow, MD;  Location: [facility] ENDOSCOPY;  Service: Endoscopy;  Laterality: N/A;   GANGLION CYST EXCISION Left 08/2006   Patient Active Problem List   Diagnosis Date Noted   CN (constipation) 03/19/2015    ONSET DATE: 2 years  REFERRING DIAG: Parkinson's Disease  THERAPY DIAG:  Muscle weakness (generalized)  Other lack of coordination  Other abnormalities of gait and mobility  Rationale for  Evaluation and Treatment: Rehabilitation  SUBJECTIVE:  SUBJECTIVE STATEMENT: Pt reports that he did try his exercises this morning at home.    PERTINENT HISTORY: Per note from Dr. Manuella Ghazi on 09/22/22 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  Parkinson's Disease in a patient with unilateral left hand resting tremor, loss of sense of smell, imbalance, dream enactment, constipation, shuffling gait, short steps, decreased arm swing (R > L), slowness of the movements, feeling of stiffness: Patient reports having a left sided tremor and imbalance since about 2021. He reports it is hard for him to stand for a long time. Patient reports loss of sense of smell in about 2005 and constipation since about 2015 (Miralax and dulcolax). Patient also reports pulling sensation to the left. Patient reports he has always been soft-spoken. Patient reports no family history of Parkinson's disease.  At initial evaluation Mr. Cohick denied decreased volume of speech, drooling, difficulty swallowing, decreased facial expressions, small hand writing, hunched forward posture, transient visual hallucination, depression, anxiety, urinary urgency, sexual dysfunction, drop in blood pressure with change in posture etc.   PRECAUTIONS: Fall  WEIGHT BEARING RESTRICTIONS: No  PAIN:  Are you having pain? No  FALLS: Has patient fallen in last 6 months? No  LIVING ENVIRONMENT: Lives with: lives with their family, spouse and daughter Lives in: 2 level home Stairs: Yes: Internal: 1 flight steps; on left going  up and External: 2 steps; on left going up Has following equipment at home: None  PLOF: Independent  PATIENT GOALS: Improve balance  OBJECTIVE:   HAND DOMINANCE: Left  ADLs: Overall ADLs: indep Transfers/ambulation related to ADLs: indep Eating: able to cut own food Grooming: indep UB Dressing: indep LB Dressing: indep Toileting: modified  indep with elevated toilet seat Bathing: indep Tub Shower transfers: indep with walk in shower Equipment: none  IADLs: Shopping: spouse manages  Light housekeeping: pt participates in light housekeeping, sweeping, vacuuming Meal Prep: spouse manages at main meals at baseline; pt can make his tea or coffee  Community mobility: fatigues with community mobility  Medication management: pt/spouse report pt is indep with medications  Financial management: pt and spouse manage together  Handwriting: 100% legible and Increased time  MOBILITY STATUS:  Shuffling of gait; pt endorses some occasional freezing, decreased arm swing bilaterally but worse on the R  POSTURE COMMENTS:  rounded shoulders Sitting balance: Moves/returns truncal midpoint >2 inches in all planes  ACTIVITY TOLERANCE: Activity tolerance: Pt reports fatigue with community mobility.   FUNCTIONAL OUTCOME MEASURES: FOTO: 51; predicted 62  UPPER EXTREMITY ROM:  BUEs WFL  UPPER EXTREMITY MMT:   BUEs 5/5  HAND FUNCTION: Grip strength: Right: 53 lbs; Left: 40 lbs, Lateral pinch: Right: 20 lbs, Left: 21 lbs, and 3 point pinch: Right: 14 lbs, Left: 14 lbs  COORDINATION: 9 Hole Peg test: Right: 42 sec; Left: 37 sec  5x STS: 15 sec (>16 sec is fall risk) BERG: 49/56 (low fall risk); difficulty with tandem stance (unable to hold heel to toe), forward reach (9"), single leg stance (R leg 3 sec), and turning to the R 360* (5 sec)  6 MWT: Will plan to complete next session  SENSATION: To be further assessed  MUSCLE TONE: Pt endorses some general stiffness; no rigidity noted   COGNITION: Overall cognitive status: decreased short term memory, increased time for processing information (increased time for 1 and 2 step commands)  VISION: Subjective report: wears glasses all the time, bifocals, hx of cataracts removed both sides   PERCEPTION: WFL  PRAXIS: Impaired: Motor planning; FOGQ: 5/24 (minimal freezing of  gait)  OBSERVATIONS: Noted shuffling gait, resting LUE tremor, decreased arm swing bilaterally but R>L  TODAY'S TREATMENT:                                                                                                                              DATE: 10/23/22 LSVT: Self Care: Pt practiced threading zipper on coat x5 reps.  Pt required set up of coat and both sides of zipper to assist with spatial orientation for first rep.  Extra time needed to complete 5 reps.  Handwriting practice: pt copied 1 sentence which was written with increased spacing between words.  Pt copied sentence, lining up words from sentence above to mimic OT spacing x3 reps.  Neuro re-ed: Patient seen for LSVT Daily Session Maximal Daily Exercises  for facilitation/coordination of movement Maximum Sustained Movements are designed to rescale the amplitude of movement output for generalization to daily functional activities. Performed as follows for 1 set of 10 repetitions each multi-directional sustained movements: 1) Floor to ceiling  2) Side to side multidirectional   Repetitive movements performed in standing and are designed to provide retraining effort needed for sustained muscle activation in tasks. Performed as follows for 1 set of 10 repetitions each of multi-directional repetitive movements: 3) Step and reach forward step 4) Step and reach sideways step 5) Step and reach backwards step 6) Rock and reach forward/backward  7) Rock and reach sideways   Practiced bilat arm swing with pt holding bilat rods, OT behind pt assisting to swing bilat rods to maximize anterior and posterior arm swing x3 sets 15-20 reps.  Seated marches completed with focus on foot clearance.  Provided tactile cue of OT hands above pt's knees for target to increase foot clearance with each march.  Completed 1st set with pt's hands edge of mat to provide BUE support, then 2 sets with hands crossed over chest to further engage core.  Fair  ability to maintain midline with initial posterior lean but corrected with min vc.  Completed 3 sets 10 reps each.  Trunk rotation: Pt reached with the R arm while turning L to reach for cones on table top behind him, then performed reps to reach with L hand while turning to the R to reach for cones.  Pt required intermittent min guard for balance throughout.  Completed 3 sets of 5 on each side.  Functional Component Task: Sit to stand BIG functional component task with supervision 10 reps.  Visual cues for sequencing the first 3 reps. Threading zipper: 5 reps, extra time, set up assist for spatial orientation 3. Picking an object up from floor: pt picked up 5 cones from floor, distant supv 4. Transferring from low chair 5. TBD  Hierarchy task: Handwriting: see above. 2. Stair negotiation (up/down 1 flight with hand rail): pt practiced negotiating up/down 1 step leading with L foot x5, (min guard and use of L hand rail).    BIG ambulation:  Practiced increased foot clearance providing visual cue of cones. Pt practiced stepping over a straight line of 5 cones x5 reps. Pt required min guard for balance and max vc to step over and not circumduct feet around the cones. Not yet able to add the arm swing.  Functional mobility 150 ft x5 reps with focus on big arm swing; pt required ongoing max verbal and visual cues for forward and posterior arm swing, mod vc for increased foot clearance.  Pt was unable to sustain a big arm swing and foot clearance simultaneously this date.   PATIENT EDUCATION: Education details: maximal daily exercises Person educated: Patient and Spouse Education method: Explanation, vc, tactile cues, demo, written handout Education comprehension: verbalized understanding, further training needed  HOME EXERCISE PROGRAM: Maximal daily exercises 2x daily   GOALS: Goals reviewed with patient? yes  SHORT TERM GOALS: Target date: 10/30/22 (2 weeks)  Pt will perform maximal  daily exercises with written handouts and mod vc from spouse. Baseline: Will initiate next session Goal status: INITIAL  2.  Pt will complete 360 turn test R/L in 4 sec or less to decrease fall risk with directional changes. Baseline: R 5 sec, L 4 sec Goal status: INITIAL   LONG TERM GOALS: Target date: 11/13/22 (4 weeks)  Pt will increase FOTO score by 5 or more  points to indicate clinically relevant improvement in daily tasks. Baseline: Eval: 51; predicted 62 Goal status: INITIAL  2.  Pt will demo increased tolerance for community mobility as noted by increase in 6 MWT by 100 ft. Baseline: Eval NT d/t time constraints: To be performed next session; 10/20/22: 1,302 ft Goal status: INITIAL  3.  Pt will demo increased balance with ADLs and mobility as noted by increase in BERG balance score by 3 points) Baseline: Eval: 49/56 Goal status: INITIAL  4.  Pt will easily reach to floor to pick up an ADL item with indep Baseline: Eval: supv to pick up pen from floor (increased time, effort, wide BOS) Goal status: INITIAL  5.  Pt will be modified indep to negotiate flight of steps with use of handrail. Baseline: Pt avoids 2nd floor of his home Goal status: INITIAL   ASSESSMENT:  CLINICAL IMPRESSION: Pt and spouse report that pt did attempt his exercises at home this morning.  OT provided positive feedback with encouragement to continue to try MDE 2x daily.  Pt continues to be challenged with dual tasking when asked to keep rep count and hold count during maximal daily exercises; max vc needed.   Pt initiated each exercise first reviewing pictures and written directions with written HEP, requiring mod verbal and visual cues to correctly initiate 30% of the exercises. Pt requires extensive tactile cues to formulate proper arm position for most of the MDE d/t difficulty coordinating arms and legs together.  Pt struggles to replicate the same movements when changing from L to R, requiring extensive  tactile cues to formulate correct movements.  OT provided education to pt/spouse on pt responding better to tactile cues rather than verbal cues and encouraged tactile cues at home for improved comprehension of commands when necessary.  Pt continues to be challenged with sustaining a big arm swing and sufficient foot clearance simultaneously.  Pt continues to knock down cones frequently when stepping over cones, requiring ongoing max vc and visual cues for bigger foot clearance and to avoid circumduction of feet around the cones.  Not yet able to add the arm swing when stepping over cones.  Pt will continue to benefit from skilled OT with participation in [place] program to increase amplitude of movements and kinesthetic awareness in order to maximize balance, endurance, and strength for ADLs and mobility.  PERFORMANCE DEFICITS: in functional skills including IADLs, coordination, dexterity, proprioception, strength, flexibility, Fine motor control, Gross motor control, mobility, balance, body mechanics, endurance, decreased knowledge of use of DME, and UE functional use, cognitive skills including memory, safety awareness, and understand, and psychosocial skills including habits and routines and behaviors.   IMPAIRMENTS: are limiting patient from IADLs, rest and sleep, and leisure.   CO-MORBIDITIES: may have co-morbidities  that affects occupational performance. Patient will benefit from skilled OT to address above impairments and improve overall function.  MODIFICATION OR ASSISTANCE TO COMPLETE EVALUATION: No modification of tasks or assist necessary to complete an evaluation.  OT OCCUPATIONAL PROFILE AND HISTORY: Problem focused assessment: Including review of records relating to presenting problem.  CLINICAL DECISION MAKING: Moderate - several treatment options, min-mod task modification necessary  REHAB POTENTIAL: Good  EVALUATION COMPLEXITY: Moderate    PLAN:  OT FREQUENCY: eval on  10/15/21, will begin 4x/week on 10/20/22  OT DURATION: 4 weeks  PLANNED INTERVENTIONS: self care/ADL training, therapeutic exercise, therapeutic activity, neuromuscular re-education, manual therapy, gait training, balance training, stair training, functional mobility training, patient/family education, cognitive remediation/compensation, energy conservation,  coping strategies training, and DME and/or AE instructions  RECOMMENDED OTHER SERVICES: N/A  CONSULTED AND AGREED WITH PLAN OF CARE: Patient and family member/caregiver  PLAN FOR NEXT SESSION: initiate Maximal Daily Exercises  Danelle Earthly, MS, OTR/L  [place] Dials, OT 10/29/2022, 8:52 AM

## 2022-11-01 NOTE — Therapy (Signed)
OUTPATIENT OCCUPATIONAL THERAPY LSVT BIG NEURO TREATMENT  Patient Name: Carlos Morrison MRN: [id number] DOB:Apr 18, 1941, 82 y.o., male Today's Date: 11/01/2022  PCP: Dr. Barbette Reichmann REFERRING PROVIDER: Dr. Cristopher Peru  END OF SESSION:  OT End of Session - 11/01/22 1334     Visit Number 9    Number of Visits 17    Date for OT Re-Evaluation 11/13/22    Authorization Time Period Reporting period beginning 10/16/22    Progress Note Due on Visit 10    OT Start Time 1300    OT Stop Time 1400    OT Time Calculation (min) 60 min    Equipment Utilized During Treatment none    Activity Tolerance Patient tolerated treatment well    Behavior During Therapy WFL for tasks assessed/performed             Past Medical History:  Diagnosis Date   Arthritis    Bilateral knees   GERD (gastroesophageal reflux disease)    Hyperlipidemia    Past Surgical History:  Procedure Laterality Date   CATARACT EXTRACTION W/PHACO Left 03/30/2018   Procedure: CATARACT EXTRACTION PHACO AND INTRAOCULAR LENS PLACEMENT (IOC)  LEFT;  Surgeon: Lockie Mola, MD;  Location: [facility];  Service: Ophthalmology;  Laterality: Left;   CATARACT EXTRACTION W/PHACO Right 01/29/2021   Procedure: CATARACT EXTRACTION PHACO AND INTRAOCULAR LENS PLACEMENT (IOC) RIGHT;  Surgeon: Lockie Mola, MD;  Location: [facility];  Service: Ophthalmology;  Laterality: Right;  3.24 1:03.0 5.18%   COLONOSCOPY  2011   Dr. Kizzie Ide   COLONOSCOPY N/A 05/01/2015   Procedure: COLONOSCOPY;  Surgeon: Earline Mayotte, MD;  Location: [facility];  Service: Endoscopy;  Laterality: N/A;   GANGLION CYST EXCISION Left 08/2006   Patient Active Problem List   Diagnosis Date Noted   CN (constipation) 03/19/2015    ONSET DATE: 2 years  REFERRING DIAG: Parkinson's Disease  THERAPY DIAG:  Muscle weakness (generalized)  Other lack of coordination  Other abnormalities of gait and mobility  Rationale for  Evaluation and Treatment: Rehabilitation  SUBJECTIVE:  SUBJECTIVE STATEMENT: Pt reports doing well today.  PERTINENT HISTORY: Per note from Dr. Sherryll Burger on 09/22/22 Carlos Morrison is a left handed 82 y.o. 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  Parkinson's Disease in a patient with unilateral left hand resting tremor, loss of sense of smell, imbalance, dream enactment, constipation, shuffling gait, short steps, decreased arm swing (R > L), slowness of the movements, feeling of stiffness: Patient reports having a left sided tremor and imbalance since about 2021. He reports it is hard for him to stand for a long time. Patient reports loss of sense of smell in about 2005 and constipation since about 2015 (Miralax and dulcolax). Patient also reports pulling sensation to the left. Patient reports he has always been soft-spoken. Patient reports no family history of Parkinson's disease.  At initial evaluation Carlos Morrison denied decreased volume of speech, drooling, difficulty swallowing, decreased facial expressions, small hand writing, hunched forward posture, transient visual hallucination, depression, anxiety, urinary urgency, sexual dysfunction, drop in blood pressure with change in posture etc.   PRECAUTIONS: Fall  WEIGHT BEARING RESTRICTIONS: No  PAIN:  Are you having pain? No  FALLS: Has patient fallen in last 6 months? No  LIVING ENVIRONMENT: Lives with: lives with their family, spouse and daughter Lives in: 2 level home Stairs: Yes: Internal: 1 flight steps; on left going up and External: 2 steps; on left going up  Has following equipment at home: None  PLOF: Independent  PATIENT GOALS: Improve balance  OBJECTIVE:   HAND DOMINANCE: Left  ADLs: Overall ADLs: indep Transfers/ambulation related to ADLs: indep Eating: able to cut own food Grooming: indep UB Dressing: indep LB Dressing: indep Toileting: modified indep with elevated toilet  seat Bathing: indep Tub Shower transfers: indep with walk in shower Equipment: none  IADLs: Shopping: spouse manages  Light housekeeping: pt participates in light housekeeping, sweeping, vacuuming Meal Prep: spouse manages at main meals at baseline; pt can make his tea or coffee  Community mobility: fatigues with community mobility  Medication management: pt/spouse report pt is indep with medications  Financial management: pt and spouse manage together  Handwriting: 100% legible and Increased time  MOBILITY STATUS:  Shuffling of gait; pt endorses some occasional freezing, decreased arm swing bilaterally but worse on the R  POSTURE COMMENTS:  rounded shoulders Sitting balance: Moves/returns truncal midpoint >2 inches in all planes  ACTIVITY TOLERANCE: Activity tolerance: Pt reports fatigue with community mobility.   FUNCTIONAL OUTCOME MEASURES: FOTO: 51; predicted 62  UPPER EXTREMITY ROM:  BUEs WFL  UPPER EXTREMITY MMT:   BUEs 5/5  HAND FUNCTION: Grip strength: Right: 53 lbs; Left: 40 lbs, Lateral pinch: Right: 20 lbs, Left: 21 lbs, and 3 point pinch: Right: 14 lbs, Left: 14 lbs  COORDINATION: 9 Hole Peg test: Right: 42 sec; Left: 37 sec  5x STS: 15 sec (>16 sec is fall risk) BERG: 49/56 (low fall risk); difficulty with tandem stance (unable to hold heel to toe), forward reach (9"), single leg stance (R leg 3 sec), and turning to the R 360* (5 sec)  6 MWT: Will plan to complete next session  SENSATION: To be further assessed  MUSCLE TONE: Pt endorses some general stiffness; no rigidity noted   COGNITION: Overall cognitive status: decreased short term memory, increased time for processing information (increased time for 1 and 2 step commands)  VISION: Subjective report: wears glasses all the time, bifocals, hx of cataracts removed both sides   PERCEPTION: WFL  PRAXIS: Impaired: Motor planning; FOGQ: 5/24 (minimal freezing of gait)  OBSERVATIONS: Noted shuffling  gait, resting LUE tremor, decreased arm swing bilaterally but R>L  TODAY'S TREATMENT:                                                                                                                              DATE: 10/23/22 LSVT: Self Care: Pt practiced threading zipper on coat x5 reps.  Pt required set up of coat and both sides of zipper to assist with spatial orientation for first rep.  Extra time needed to complete 5 reps.  Handwriting practice: pt copied 1 short sentence which was written with increased spacing between words.  Pt copied sentence, lining up words from sentence above to mimic OT spacing x5 reps.  Neuro re-ed: Patient seen for LSVT Daily Session Maximal Daily Exercises for facilitation/coordination of movement Maximum Sustained Movements are  designed to rescale the amplitude of movement output for generalization to daily functional activities. Performed as follows for 1 set of 10 repetitions each multi-directional sustained movements: 1) Floor to ceiling  2) Side to side multidirectional   Repetitive movements performed in standing and are designed to provide retraining effort needed for sustained muscle activation in tasks. Performed as follows for 1 set of 10 repetitions each of multi-directional repetitive movements: 3) Step and reach forward step 4) Step and reach sideways step 5) Step and reach backwards step 6) Rock and reach forward/backward  7) Rock and reach sideways   Practiced bilat arm swing with pt holding bilat rods, OT positioned in front of pt assisting to swing bilat rods to maximize anterior and posterior arm swing x3 sets 15-20 reps.  Seated marches completed with focus on foot clearance.  Provided tactile cue of OT hands above pt's knees for target to increase foot clearance with each march.  Intermittent min vc to ensure knees hit hands with each march rep.  Completed 3 sets 10 reps each.  Functional Component Task: Sit to stand BIG functional  component task with supervision 10 reps.  Visual cues for sequencing the first 2 reps. Threading zipper: 5 reps, extra time, set up assist for spatial orientation 3. Picking an object up from floor: pt picked up 5 cones from floor; indep 4. Transferring from low chair 5. TBD  Hierarchy task: Handwriting: see above. 2. Stair negotiation (up/down 1 flight with hand rail): pt practiced negotiating up/down 1 step leading with L foot x5, (SBA and intermittent use of L hand rail).    BIG ambulation:  Practiced increased foot clearance providing visual cue of cones. Pt practiced stepping over a straight line of 5 cones x10 reps. Pt required SBA and max vc to step over and not circumduct feet around the cones. Not yet able to add the arm swing.  Functional mobility 150 ft x5 reps with focus on big arm swing; pt required ongoing max verbal and visual cues for forward and posterior arm swing, mod vc for increased foot clearance.  Pt was unable to sustain a big arm swing and foot clearance simultaneously this date.   PATIENT EDUCATION: Education details: maximal daily exercises Person educated: Patient and Spouse Education method: Explanation, vc, tactile cues, demo, written handout Education comprehension: verbalized understanding, further training needed  HOME EXERCISE PROGRAM: Maximal daily exercises 2x daily   GOALS: Goals reviewed with patient? yes  SHORT TERM GOALS: Target date: 10/30/22 (2 weeks)  Pt will perform maximal daily exercises with written handouts and mod vc from spouse. Baseline: Will initiate next session Goal status: INITIAL  2.  Pt will complete 360 turn test R/L in 4 sec or less to decrease fall risk with directional changes. Baseline: R 5 sec, L 4 sec Goal status: INITIAL   LONG TERM GOALS: Target date: 11/13/22 (4 weeks)  Pt will increase FOTO score by 5 or more points to indicate clinically relevant improvement in daily tasks. Baseline: Eval: 51; predicted 62 Goal  status: INITIAL  2.  Pt will demo increased tolerance for community mobility as noted by increase in 6 MWT by 100 ft. Baseline: Eval NT d/t time constraints: To be performed next session; 10/20/22: 1,302 ft Goal status: INITIAL  3.  Pt will demo increased balance with ADLs and mobility as noted by increase in BERG balance score by 3 points) Baseline: Eval: 49/56 Goal status: INITIAL  4.  Pt will easily reach to floor  to pick up an ADL item with indep Baseline: Eval: supv to pick up pen from floor (increased time, effort, wide BOS) Goal status: INITIAL  5.  Pt will be modified indep to negotiate flight of steps with use of handrail. Baseline: Pt avoids 2nd floor of his home Goal status: INITIAL   ASSESSMENT:  CLINICAL IMPRESSION: Pt demonstrated need for fewer tactile cues this date during transitions between R/L sides with maximal daily exercises, and fewer cues to coordinate alternating UE arm swing with Les during forward and sideways rock and reach exercises.  Pt continues to be challenged with dual tasking when asked to keep rep count and hold count during maximal daily exercises; max vc needed.   Pt continues to work on initiating each exercise first reviewing pictures and written directions with written HEP, requiring min-mod verbal and visual cues to correctly initiate 30% of the exercises.  Pt improving with balance on stairs, leading with L (weaker side) today to step up/down 1 step x 5 reps without use of handrail.  Pt improving with cone drills, reaching 10 sets today with SBA (previously 5 sets with min guard) to step over a row of 5 cones, though multiple trials required to reach 10 successful sets.  Not yet able to add the arm swing when stepping over cones.  With big walking, pt struggles to maintain simultaneous big arm swing with big steps (1 or the other), and posterior arm swing is not maintained.  Pt will continue to benefit from skilled OT with participation in LSVT BIG  program to increase amplitude of movements and kinesthetic awareness in order to maximize balance, endurance, and strength for ADLs and mobility.  PERFORMANCE DEFICITS: in functional skills including IADLs, coordination, dexterity, proprioception, strength, flexibility, Fine motor control, Gross motor control, mobility, balance, body mechanics, endurance, decreased knowledge of use of DME, and UE functional use, cognitive skills including memory, safety awareness, and understand, and psychosocial skills including habits and routines and behaviors.   IMPAIRMENTS: are limiting patient from IADLs, rest and sleep, and leisure.   CO-MORBIDITIES: may have co-morbidities  that affects occupational performance. Patient will benefit from skilled OT to address above impairments and improve overall function.  MODIFICATION OR ASSISTANCE TO COMPLETE EVALUATION: No modification of tasks or assist necessary to complete an evaluation.  OT OCCUPATIONAL PROFILE AND HISTORY: Problem focused assessment: Including review of records relating to presenting problem.  CLINICAL DECISION MAKING: Moderate - several treatment options, min-mod task modification necessary  REHAB POTENTIAL: Good  EVALUATION COMPLEXITY: Moderate    PLAN:  OT FREQUENCY: eval on 10/15/21, will begin 4x/week on 10/20/22  OT DURATION: 4 weeks  PLANNED INTERVENTIONS: self care/ADL training, therapeutic exercise, therapeutic activity, neuromuscular re-education, manual therapy, gait training, balance training, stair training, functional mobility training, patient/family education, cognitive remediation/compensation, energy conservation, coping strategies training, and DME and/or AE instructions  RECOMMENDED OTHER SERVICES: N/A  CONSULTED AND AGREED WITH PLAN OF CARE: Patient and family member/caregiver  PLAN FOR NEXT SESSION: Maximal Daily Exercises, 10th visit reassessment  Danelle Earthly, MS, OTR/L  Otis Dials, OT 11/01/2022, 1:35  PM

## 2022-11-02 ENCOUNTER — Ambulatory Visit: Payer: Medicare Other | Admitting: Occupational Therapy

## 2022-11-02 DIAGNOSIS — R278 Other lack of coordination: Secondary | ICD-10-CM

## 2022-11-02 DIAGNOSIS — M6281 Muscle weakness (generalized): Secondary | ICD-10-CM

## 2022-11-03 ENCOUNTER — Ambulatory Visit: Payer: Medicare Other

## 2022-11-03 ENCOUNTER — Encounter: Payer: Self-pay | Admitting: Occupational Therapy

## 2022-11-03 DIAGNOSIS — M6281 Muscle weakness (generalized): Secondary | ICD-10-CM

## 2022-11-03 DIAGNOSIS — R2689 Other abnormalities of gait and mobility: Secondary | ICD-10-CM

## 2022-11-03 DIAGNOSIS — R278 Other lack of coordination: Secondary | ICD-10-CM

## 2022-11-03 NOTE — Therapy (Incomplete)
OUTPATIENT OCCUPATIONAL THERAPY LSVT BIG NEURO TREATMENT/PROGRESS UPDATE REPORTING PERIOD FROM 10/16/2022 TO 11/02/2022  Patient Name: Carlos Morrison MRN: [id number] DOB:[birth year], 82 y.o., male Today's Date: 11/02/2022  PCP: Dr. Tracie Harrier REFERRING PROVIDER: Dr. Jennings Books  END OF SESSION:  OT End of Session - 11/03/22 0929     Visit Number 10    Number of Visits 17    Date for OT Re-Evaluation 11/13/22    Authorization Time Period Reporting period beginning 10/16/22    Progress Note Due on Visit 10    OT Start Time 1100    OT Stop Time 1202    OT Time Calculation (min) 62 min    Equipment Utilized During Treatment none    Activity Tolerance Patient tolerated treatment well    Behavior During Therapy WFL for tasks assessed/performed             Past Medical History:  Diagnosis Date   Arthritis    Bilateral knees   GERD (gastroesophageal reflux disease)    Hyperlipidemia    Past Surgical History:  Procedure Laterality Date   CATARACT EXTRACTION W/PHACO Left 03/30/2018   Procedure: CATARACT EXTRACTION PHACO AND INTRAOCULAR LENS PLACEMENT ([place])  LEFT;  Surgeon: Leandrew Koyanagi, MD;  Location: [place];  Service: Ophthalmology;  Laterality: Left;   CATARACT EXTRACTION W/PHACO Right 01/29/2021   Procedure: CATARACT EXTRACTION PHACO AND INTRAOCULAR LENS PLACEMENT ([place]) RIGHT;  Surgeon: Leandrew Koyanagi, MD;  Location: [place];  Service: Ophthalmology;  Laterality: Right;  3.24 1:03.0 5.18%   COLONOSCOPY  2011   Dr. Bobbye [place]   COLONOSCOPY N/A 05/01/2015   Procedure: COLONOSCOPY;  Surgeon: Robert Bellow, MD;  Location: [facility] ENDOSCOPY;  Service: Endoscopy;  Laterality: N/A;   GANGLION CYST EXCISION Left 08/2006   Patient Active Problem List   Diagnosis Date Noted   CN (constipation) 03/19/2015    ONSET DATE: 2 years  REFERRING DIAG: Parkinson's Disease  THERAPY DIAG:  Muscle weakness (generalized)  Other lack of  coordination  Rationale for Evaluation and Treatment: Rehabilitation  SUBJECTIVE:  SUBJECTIVE STATEMENT:   PERTINENT HISTORY: Per note from Dr. Manuella Ghazi on 09/22/22 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  Parkinson's Disease in a patient with unilateral left hand resting tremor, loss of sense of smell, imbalance, dream enactment, constipation, shuffling gait, short steps, decreased arm swing (R > L), slowness of the movements, feeling of stiffness: Patient reports having a left sided tremor and imbalance since about 2021. He reports it is hard for him to stand for a long time. Patient reports loss of sense of smell in about 2005 and constipation since about 2015 (Miralax and dulcolax). Patient also reports pulling sensation to the left. Patient reports he has always been soft-spoken. Patient reports no family history of Parkinson's disease.  At initial evaluation [name] denied decreased volume of speech, drooling, difficulty swallowing, decreased facial expressions, small hand writing, hunched forward posture, transient visual hallucination, depression, anxiety, urinary urgency, sexual dysfunction, drop in blood pressure with change in posture etc.   PRECAUTIONS: Fall  WEIGHT BEARING RESTRICTIONS: No  PAIN:  Are you having pain? No  FALLS: Has patient fallen in last 6 months? No  LIVING ENVIRONMENT: Lives with: lives with their family, spouse and daughter Lives in: 2 level home Stairs: Yes: Internal: 1 flight steps; on left going up and External: 2 steps; on left going up Has following equipment at  home: None  PLOF: Independent  PATIENT GOALS: Improve balance  OBJECTIVE:   HAND DOMINANCE: Left  ADLs: Overall ADLs: indep Transfers/ambulation related to ADLs: indep Eating: able to cut own food Grooming: indep UB Dressing: indep LB Dressing: indep Toileting: modified indep with elevated toilet  seat Bathing: indep Tub Shower transfers: indep with walk in shower Equipment: none  IADLs: Shopping: spouse manages  Light housekeeping: pt participates in light housekeeping, sweeping, vacuuming Meal Prep: spouse manages at main meals at baseline; pt can make his tea or coffee  Community mobility: fatigues with community mobility  Medication management: pt/spouse report pt is indep with medications  Financial management: pt and spouse manage together  Handwriting: 100% legible and Increased time  MOBILITY STATUS:  Shuffling of gait; pt endorses some occasional freezing, decreased arm swing bilaterally but worse on the R  POSTURE COMMENTS:  rounded shoulders Sitting balance: Moves/returns truncal midpoint >2 inches in all planes  ACTIVITY TOLERANCE: Activity tolerance: Pt reports fatigue with community mobility.   FUNCTIONAL OUTCOME MEASURES: FOTO: 51; predicted 62 , 10th visit score:  65  UPPER EXTREMITY ROM:  BUEs WFL  UPPER EXTREMITY MMT:   BUEs 5/5  HAND FUNCTION: Grip strength: Right: 53 lbs; Left: 40 lbs, Lateral pinch: Right: 20 lbs, Left: 21 lbs, and 3 point pinch: Right: 14 lbs, Left: 14 lbs  COORDINATION: 9 Hole Peg test: Right: 42 sec; Left: 37 sec  5x STS: 15 sec (>16 sec is fall risk) BERG: 49/56 (low fall risk); difficulty with tandem stance (unable to hold heel to toe), forward reach (9"), single leg stance (R leg 3 sec), and turning to the R 360* (5 sec)  6 MWT: Will plan to complete next session  SENSATION: To be further assessed  MUSCLE TONE: Pt endorses some general stiffness; no rigidity noted   COGNITION: Overall cognitive status: decreased short term memory, increased time for processing information (increased time for 1 and 2 step commands)  VISION: Subjective report: wears glasses all the time, bifocals, hx of cataracts removed both sides   PERCEPTION: WFL  PRAXIS: Impaired: Motor planning; FOGQ: 5/24 (minimal freezing of  gait)  OBSERVATIONS: Noted shuffling gait, resting LUE tremor, decreased arm swing bilaterally but R>L  TODAY'S TREATMENT:                                                                                                                              DATE: 11/03/22 LSVT: Self Care: Reassessment of handwriting skills this date, pt performing name in print and script, with larger formation of letters, good spacing and improved legibility overall.    Neuro re-ed: Patient seen for LSVT Daily Session Maximal Daily Exercises for facilitation/coordination of movement Maximum Sustained Movements are designed to rescale the amplitude of movement output for generalization to daily functional activities. Performed as follows for 1 set of 10 repetitions each multi-directional sustained movements: 1) Floor to ceiling  2) Side to side multidirectional   Repetitive  movements performed in standing and are designed to provide retraining effort needed for sustained muscle activation in tasks. Performed as follows for 1 set of 10 repetitions each of multi-directional repetitive movements: 3) Step and reach forward step 4) Step and reach sideways step 5) Step and reach backwards step 6) Rock and reach forward/backward  7) Rock and reach sideways   Pt required verbal cues for correct form and technique with maximal daily exercises.  Unable to recall exercises when prompted but reports they are all familiar when instructed on how to perform.  Appears to have some motor planning issues at times and coordination of UE and LE when there are demands for both.   Wife present and is helpful for providing cues as needed.   Manual cues this date to facilitate side to side multidirectional reach with placement on side of mat and to extend LE.  Pt demonstrates difficulty with coordination of rock and reach forward/backward and requires mod to max cues.   Functional Component Task: Sit to stand BIG functional component  task with supervision 5 reps Threading zipper: 5 reps, extra time, set up assist for spatial orientation 3. Picking an object up from floor: pt picked up 5 cones from floor; indep 4. Transferring from low chair 5. TBD  Hierarchy task: Handwriting: see above. 2. Stair negotiation (up/down 1 flight with hand rail): pt practiced negotiating up/down 1 step leading with L foot x5, (SBA and intermittent use of L hand rail).    BIG ambulation:  6 min walk test this date 1355 feet.  Pt demonstrating reciprocal arm swing but still on a smaller scale.  Turns tend to be wide and lack precision.    5 times sit to stand 14 secs.  Berg Balance Test:  51/56 Difficulty with tandem stance, single leg stance   PATIENT EDUCATION: Education details: maximal daily exercises Person educated: Patient and Spouse Education method: Explanation, vc, tactile cues, demo, written handout Education comprehension: verbalized understanding, further training needed  HOME EXERCISE PROGRAM: Maximal daily exercises 2x daily   GOALS: Goals reviewed with patient? yes  SHORT TERM GOALS: Target date: 10/30/22 (2 weeks)  Pt will perform maximal daily exercises with written handouts and mod vc from spouse. Baseline: Will initiate next session, 10TH VISIT:  continues to require assist with select exercises for balance Goal status: ONGOING  2.  Pt will complete 360 turn test R/L in 4 sec or less to decrease fall risk with directional changes. Baseline: R 5 sec, L 4 sec, 10th visit:  4 sec right, 4 sec left  Goal status: MET    LONG TERM GOALS: Target date: 11/13/22 (4 weeks)  Pt will increase FOTO score by 5 or more points to indicate clinically relevant improvement in daily tasks. Baseline: Eval: 51; predicted 62, 10TH visit: 65 Goal status: MET  2.  Pt will demo increased tolerance for community mobility as noted by increase in 6 MWT by 100 ft. Baseline: Eval NT d/t time constraints: To be performed next session;  10/20/22: 1,302 ft, 10th visit: 1355 feet  Goal status: ONGOING   3.  Pt will demo increased balance with ADLs and mobility as noted by increase in BERG balance score by 3 points) Baseline: Eval: 49/56, 10th visit:  Goal status: ONGOING  4.  Pt will easily reach to floor to pick up an ADL item with indep Baseline: Eval: supv to pick up pen from floor (increased time, effort, wide BOS), 10th visit: continues to require increased  time, effort and larger BOS Goal status: ONGOING  5.  Pt will be modified indep to negotiate flight of steps with use of handrail. Baseline: Pt avoids 2nd floor of his home, 10th visit: remains uncomfortable with stairs Goal status: ONGOING    ASSESSMENT:  CLINICAL IMPRESSION: Pt has made good progress in all areas and demonstrating improvements in all of his outcome measures this date.  Pt feels his primary problem is with balance.  Difficulty with tandem stance and single leg stance during BERG balance reassessment.  Pt demonstrates some difficulty at times with modeling of movement patterns and requires hands on manual guiding.  Difficulty with coordination of rock and reach exercise in standing  with UE and LE movement patterns.  6 min walk test improved with greater amplitude of steps and needs to continue to work towards reciprocal arm swing during mobility.  He continues to benefit from skilled OT services to maximize safety and independence in necessary daily tasks.  Goals updated to reflect progress.   PERFORMANCE DEFICITS: in functional skills including IADLs, coordination, dexterity, proprioception, strength, flexibility, Fine motor control, Gross motor control, mobility, balance, body mechanics, endurance, decreased knowledge of use of DME, and UE functional use, cognitive skills including memory, safety awareness, and understand, and psychosocial skills including habits and routines and behaviors.   IMPAIRMENTS: are limiting patient from IADLs, rest and  sleep, and leisure.   CO-MORBIDITIES: may have co-morbidities  that affects occupational performance. Patient will benefit from skilled OT to address above impairments and improve overall function.  MODIFICATION OR ASSISTANCE TO COMPLETE EVALUATION: No modification of tasks or assist necessary to complete an evaluation.  OT OCCUPATIONAL PROFILE AND HISTORY: Problem focused assessment: Including review of records relating to presenting problem.  CLINICAL DECISION MAKING: Moderate - several treatment options, min-mod task modification necessary  REHAB POTENTIAL: Good  EVALUATION COMPLEXITY: Moderate    PLAN:  OT FREQUENCY: eval on 10/15/21, will begin 4x/week on 10/20/22  OT DURATION: 4 weeks  PLANNED INTERVENTIONS: self care/ADL training, therapeutic exercise, therapeutic activity, neuromuscular re-education, manual therapy, gait training, balance training, stair training, functional mobility training, patient/family education, cognitive remediation/compensation, energy conservation, coping strategies training, and DME and/or AE instructions  RECOMMENDED OTHER SERVICES: N/A  CONSULTED AND AGREED WITH PLAN OF CARE: Patient and family member/caregiver  PLAN FOR NEXT SESSION: Maximal Daily Exercises, continued focus on functional balance  Tinley Rought T Sohan Potvin, OTR/L, CLT  Chatara Lucente, OT 11/03/2022, 9:33 AM

## 2022-11-04 ENCOUNTER — Ambulatory Visit: Payer: Medicare Other

## 2022-11-04 DIAGNOSIS — R278 Other lack of coordination: Secondary | ICD-10-CM | POA: Diagnosis not present

## 2022-11-04 DIAGNOSIS — M6281 Muscle weakness (generalized): Secondary | ICD-10-CM

## 2022-11-04 DIAGNOSIS — R2689 Other abnormalities of gait and mobility: Secondary | ICD-10-CM

## 2022-11-04 NOTE — Therapy (Signed)
OUTPATIENT OCCUPATIONAL THERAPY LSVT BIG NEURO TREATMENT Patient Name: Carlos Morrison MRN: [id number] DOB:1941/02/17, 82 y.o., male Today's Date: 11/02/2022  PCP: Dr. Barbette Reichmann REFERRING PROVIDER: Dr. Cristopher Peru  END OF SESSION:  OT End of Session - 11/04/22 0820     Visit Number 11    Number of Visits 17    Date for OT Re-Evaluation 11/13/22    Authorization Time Period Reporting period beginning 11/03/22    Progress Note Due Morrison Visit 10    OT Start Time 1307    OT Stop Time 1400    OT Time Calculation (min) 53 min    Equipment Utilized During Treatment none    Activity Tolerance Patient tolerated treatment well    Behavior During Therapy WFL for tasks assessed/performed             Past Medical History:  Diagnosis Date   Arthritis    Bilateral knees   GERD (gastroesophageal reflux disease)    Hyperlipidemia    Past Surgical History:  Procedure Laterality Date   CATARACT EXTRACTION W/PHACO Left 03/30/2018   Procedure: CATARACT EXTRACTION PHACO AND INTRAOCULAR LENS PLACEMENT (IOC)  LEFT;  Surgeon: Lockie Mola, MD;  Location: [facility];  Service: Ophthalmology;  Laterality: Left;   CATARACT EXTRACTION W/PHACO Right 01/29/2021   Procedure: CATARACT EXTRACTION PHACO AND INTRAOCULAR LENS PLACEMENT (IOC) RIGHT;  Surgeon: Lockie Mola, MD;  Location: [facility];  Service: Ophthalmology;  Laterality: Right;  3.24 1:03.0 5.18%   COLONOSCOPY  2011   Dr. Kizzie Ide   COLONOSCOPY N/A 05/01/2015   Procedure: COLONOSCOPY;  Surgeon: Earline Mayotte, MD;  Location: [facility];  Service: Endoscopy;  Laterality: N/A;   GANGLION CYST EXCISION Left 08/2006   Patient Active Problem List   Diagnosis Date Noted   CN (constipation) 03/19/2015    ONSET DATE: 2 years  REFERRING DIAG: Parkinson's Disease  THERAPY DIAG:  Muscle weakness (generalized)  Other lack of coordination  Other abnormalities of gait and mobility  Rationale for  Evaluation and Treatment: Rehabilitation  SUBJECTIVE:  SUBJECTIVE STATEMENT: Pt reports doing well this date.  Pt states they had a busy morning this morning but he did try to do some of his exercises.   PERTINENT HISTORY: Per note from Dr. Sherryll Burger Morrison 09/22/22 Carlos Morrison is a left handed 82 y.o. male retired [employer] loss adjuster here for evaluation of No chief complaint Morrison file.  Parkinson's Disease in a patient with unilateral left hand resting tremor, loss of sense of smell, imbalance, dream enactment, constipation, shuffling gait, short steps, decreased arm swing (R > L), slowness of the movements, feeling of stiffness: Patient reports having a left sided tremor and imbalance since about 2021. He reports it is hard for him to stand for a long time. Patient reports loss of sense of smell in about 2005 and constipation since about 2015 (Miralax and dulcolax). Patient also reports pulling sensation to the left. Patient reports he has always been soft-spoken. Patient reports no family history of Parkinson's disease.  At initial evaluation Carlos Morrison denied decreased volume of speech, drooling, difficulty swallowing, decreased facial expressions, small hand writing, hunched forward posture, transient visual hallucination, depression, anxiety, urinary urgency, sexual dysfunction, drop in blood pressure with change in posture etc.   PRECAUTIONS: Fall  WEIGHT BEARING RESTRICTIONS: No  PAIN:  Are you having pain? No  FALLS: Has patient fallen in last 6 months? No  LIVING ENVIRONMENT: Lives with: lives with their family, spouse and daughter Lives in:  2 level home Stairs: Yes: Internal: 1 flight steps; Morrison left going up and External: 2 steps; Morrison left going up Has following equipment at home: None  PLOF: Independent  PATIENT GOALS: Improve balance  OBJECTIVE:   HAND DOMINANCE: Left  ADLs: Overall ADLs: indep Transfers/ambulation related to ADLs: indep Eating: able to cut own food Grooming: indep UB  Dressing: indep LB Dressing: indep Toileting: modified indep with elevated toilet seat Bathing: indep Tub Shower transfers: indep with walk in shower Equipment: none  IADLs: Shopping: spouse manages  Light housekeeping: pt participates in light housekeeping, sweeping, vacuuming Meal Prep: spouse manages at main meals at baseline; pt can make his tea or coffee  Community mobility: fatigues with community mobility  Medication management: pt/spouse report pt is indep with medications  Financial management: pt and spouse manage together  Handwriting: 100% legible and Increased time  MOBILITY STATUS:  Shuffling of gait; pt endorses some occasional freezing, decreased arm swing bilaterally but worse Morrison the R  POSTURE COMMENTS:  rounded shoulders Sitting balance: Moves/returns truncal midpoint >2 inches in all planes  ACTIVITY TOLERANCE: Activity tolerance: Pt reports fatigue with community mobility.   FUNCTIONAL OUTCOME MEASURES: FOTO: 51; predicted 62 , 10th visit score:  65  UPPER EXTREMITY ROM:  BUEs WFL  UPPER EXTREMITY MMT:   BUEs 5/5  HAND FUNCTION: Grip strength: Right: 53 lbs; Left: 40 lbs, Lateral pinch: Right: 20 lbs, Left: 21 lbs, and 3 point pinch: Right: 14 lbs, Left: 14 lbs  COORDINATION: 9 Hole Peg test: Right: 42 sec; Left: 37 sec  5x STS: 15 sec (>16 sec is fall risk) BERG: 49/56 (low fall risk); difficulty with tandem stance (unable to hold heel to toe), forward reach (9"), single leg stance (R leg 3 sec), and turning to the R 360* (5 sec)  6 MWT: Will plan to complete next session  SENSATION: To be further assessed  MUSCLE TONE: Pt endorses some general stiffness; no rigidity noted   COGNITION: Overall cognitive status: decreased short term memory, increased time for processing information (increased time for 1 and 2 step commands)  VISION: Subjective report: wears glasses all the time, bifocals, hx of cataracts removed both sides   PERCEPTION:  WFL  PRAXIS: Impaired: Motor planning; FOGQ: 5/24 (minimal freezing of gait)  OBSERVATIONS: Noted shuffling gait, resting LUE tremor, decreased arm swing bilaterally but R>L  TODAY'S TREATMENT:                                                                                                                              DATE:  LSVT: Self Care: Handwriting: Pt initiated his own short sentence without copying from OT to assess word spacing.  Pt able to initiate good word spacing throughout sentence for 5 repetitions of same sentence.  Neuro re-ed: Patient seen for LSVT Daily Session Maximal Daily Exercises for facilitation/coordination of movement Maximum Sustained Movements are designed to rescale the amplitude of movement output for generalization  to daily functional activities. Performed as follows for 1 set of 10 repetitions each multi-directional sustained movements: 1) Floor to ceiling  2) Side to side multidirectional   Repetitive movements performed in standing and are designed to provide retraining effort needed for sustained muscle activation in tasks. Performed as follows for 1 set of 10 repetitions each of multi-directional repetitive movements: 3) Step and reach forward step 4) Step and reach sideways step 5) Step and reach backwards step 6) Rock and reach forward/backward  7) Rock and reach sideways   Pt continues to require verbal and tactile cues for correct form and technique with maximal daily exercises.  Unable to recall exercises when prompted but able to initiate 50% of exercises when viewing written handout with pictures.  Pt continues to be challenged with motor planning when coordinating UE and LE when there are demands for both.   Wife present and continues to be helpful for providing cues as needed.   Manual cues this date to facilitate side to side multidirectional reach with placement Morrison side of mat and to extend LE.  Pt demonstrates difficulty with coordination of  rock and reach forward/backward and side to side and requires mod to max cues.   Functional Component Task: Sit to stand BIG functional component task with supervision 5 reps Threading zipper: 5 reps, extra time, set up assist for spatial orientation 3. Picking an object up from floor: pt picked up 5 cones from floor; indep 4. Transferring from low chair 5. TBD  Hierarchy task: Handwriting: see above. 2. Stair negotiation (up/down 1 flight with hand rail): pt practiced negotiating up/down 1 step leading with L foot x5, (SBA and intermittent use of L hand rail).    BIG ambulation:  128ft x5 with max verbal and visual cues for increasing arm swing, especially Morrison the R arm posteriorly.  Intermittent cues for bigger foot clearance.  Practiced bilat arm swing with pt holding bilat rods, OT positioned in front of pt assisting to swing bilat rods to maximize anterior and posterior arm swing x3 sets 15 reps.   Cone drills:  Pt stepped over 5 cones x10 reps, requiring at least 20 trials to reach 10 reps without knocking down cones.   Seated marches: 3 sets 10 reps using hands as target for knee lift.  PATIENT EDUCATION: Education details: maximal daily exercises Person educated: Patient and Spouse Education method: Explanation, vc, tactile cues, demo, written handout Education comprehension: verbalized understanding, further training needed  HOME EXERCISE PROGRAM: Maximal daily exercises 2x daily   GOALS: Goals reviewed with patient? yes  SHORT TERM GOALS: Target date: 10/30/22 (2 weeks)  Pt will perform maximal daily exercises with written handouts and mod vc from spouse. Baseline: Will initiate next session, 10TH VISIT:  continues to require assist with select exercises for balance Goal status: ONGOING  2.  Pt will complete 360 turn test R/L in 4 sec or less to decrease fall risk with directional changes. Baseline: R 5 sec, L 4 sec, 10th visit:  4 sec right, 4 sec left  Goal  status: MET    LONG TERM GOALS: Target date: 11/13/22 (4 weeks)  Pt will increase FOTO score by 5 or more points to indicate clinically relevant improvement in daily tasks. Baseline: Eval: 51; predicted 62, 10TH visit: 65 Goal status: MET  2.  Pt will demo increased tolerance for community mobility as noted by increase in 6 MWT by 100 ft. Baseline: Eval NT d/t time constraints: To be performed  next session; 10/20/22: 1,302 ft, 10th visit: 1355 feet  Goal status: ONGOING   3.  Pt will demo increased balance with ADLs and mobility as noted by increase in BERG balance score by 3 points) Baseline: Eval: 49/56, 10th visit:  Goal status: ONGOING  4.  Pt will easily reach to floor to pick up an ADL item with indep Baseline: Eval: supv to pick up pen from floor (increased time, effort, wide BOS), 10th visit: continues to require increased time, effort and larger BOS Goal status: ONGOING  5.  Pt will be modified indep to negotiate flight of steps with use of handrail. Baseline: Pt avoids 2nd floor of his home, 10th visit: remains uncomfortable with stairs Goal status: ONGOING    ASSESSMENT:  CLINICAL IMPRESSION: Pt required mod-max tactile cues this date during transitions between R/L sides with maximal daily exercises.  Pt continues to be challenged with dual tasking when asked to keep rep count and hold count during maximal daily exercises; max vc needed.   Pt continues to work Morrison initiating each exercise first reviewing pictures and written directions with written HEP, requiring written handout with pictures to initiate 50% of the exercises.  Able to add the arm swing when stepping over cones today Morrison the last few sets, but this slowed pt's steps which further challenged his balance.  With big walking, pt continues to struggle to maintain simultaneous big arm swing with big steps (1 or the other), and posterior arm swing is not maintained.  Pt will continue to benefit from skilled OT with  participation in [place] program to increase amplitude of movements and kinesthetic awareness in order to maximize balance, endurance, and strength for ADLs and mobility.   PERFORMANCE DEFICITS: in functional skills including IADLs, coordination, dexterity, proprioception, strength, flexibility, Fine motor control, Gross motor control, mobility, balance, body mechanics, endurance, decreased knowledge of use of DME, and UE functional use, cognitive skills including memory, safety awareness, and understand, and psychosocial skills including habits and routines and behaviors.   IMPAIRMENTS: are limiting patient from IADLs, rest and sleep, and leisure.   CO-MORBIDITIES: may have co-morbidities  that affects occupational performance. Patient will benefit from skilled OT to address above impairments and improve overall function.  MODIFICATION OR ASSISTANCE TO COMPLETE EVALUATION: No modification of tasks or assist necessary to complete an evaluation.  OT OCCUPATIONAL PROFILE AND HISTORY: Problem focused assessment: Including review of records relating to presenting problem.  CLINICAL DECISION MAKING: Moderate - several treatment options, min-mod task modification necessary  REHAB POTENTIAL: Good  EVALUATION COMPLEXITY: Moderate    PLAN:  OT FREQUENCY: eval Morrison 10/15/21, will begin 4x/week Morrison 10/20/22  OT DURATION: 4 weeks  PLANNED INTERVENTIONS: self care/ADL training, therapeutic exercise, therapeutic activity, neuromuscular re-education, manual therapy, gait training, balance training, stair training, functional mobility training, patient/family education, cognitive remediation/compensation, energy conservation, coping strategies training, and DME and/or AE instructions  RECOMMENDED OTHER SERVICES: N/A  CONSULTED AND AGREED WITH PLAN OF CARE: Patient and family member/caregiver  PLAN FOR NEXT SESSION: Maximal Daily Exercises, continued focus Morrison functional balance  Leta Speller, MS,  OTR/L   Darleene Cleaver, OT 11/04/2022, 11:50 AM

## 2022-11-05 ENCOUNTER — Ambulatory Visit: Payer: Medicare Other

## 2022-11-05 DIAGNOSIS — R278 Other lack of coordination: Secondary | ICD-10-CM

## 2022-11-05 DIAGNOSIS — R2689 Other abnormalities of gait and mobility: Secondary | ICD-10-CM

## 2022-11-05 DIAGNOSIS — M6281 Muscle weakness (generalized): Secondary | ICD-10-CM

## 2022-11-06 NOTE — Therapy (Signed)
OUTPATIENT OCCUPATIONAL THERAPY LSVT BIG NEURO TREATMENT Patient Name: CLETIS CLACK MRN: [id number] DOB:07/03/1941, 82 y.o., male Today's Date: 11/02/2022  PCP: Dr. Barbette Reichmann REFERRING PROVIDER: Dr. Cristopher Peru  END OF SESSION:  OT End of Session - 11/04/22 0820     Visit Number 11    Number of Visits 17    Date for OT Re-Evaluation 11/13/22    Authorization Time Period Reporting period beginning 11/03/22    Progress Note Due on Visit 10    OT Start Time 1307    OT Stop Time 1400    OT Time Calculation (min) 53 min    Equipment Utilized During Treatment none    Activity Tolerance Patient tolerated treatment well    Behavior During Therapy WFL for tasks assessed/performed             Past Medical History:  Diagnosis Date   Arthritis    Bilateral knees   GERD (gastroesophageal reflux disease)    Hyperlipidemia    Past Surgical History:  Procedure Laterality Date   CATARACT EXTRACTION W/PHACO Left 03/30/2018   Procedure: CATARACT EXTRACTION PHACO AND INTRAOCULAR LENS PLACEMENT (IOC)  LEFT;  Surgeon: Lockie Mola, MD;  Location: [facility];  Service: Ophthalmology;  Laterality: Left;   CATARACT EXTRACTION W/PHACO Right 01/29/2021   Procedure: CATARACT EXTRACTION PHACO AND INTRAOCULAR LENS PLACEMENT (IOC) RIGHT;  Surgeon: Lockie Mola, MD;  Location: [facility];  Service: Ophthalmology;  Laterality: Right;  3.24 1:03.0 5.18%   COLONOSCOPY  2011   Dr. Kizzie Ide   COLONOSCOPY N/A 05/01/2015   Procedure: COLONOSCOPY;  Surgeon: Earline Mayotte, MD;  Location: [facility];  Service: Endoscopy;  Laterality: N/A;   GANGLION CYST EXCISION Left 08/2006   Patient Active Problem List   Diagnosis Date Noted   CN (constipation) 03/19/2015    ONSET DATE: 2 years  REFERRING DIAG: Parkinson's Disease  THERAPY DIAG:  Muscle weakness (generalized)  Other lack of coordination  Other abnormalities of gait and mobility  Rationale for  Evaluation and Treatment: Rehabilitation  SUBJECTIVE:  SUBJECTIVE STATEMENT: Pt reports his L medial ankle is slightly sore today, but it comes and goes and reports he thinks it's arthritis.  PERTINENT HISTORY: Per note from Dr. Sherryll Burger on 09/22/22 Mr. Durnin is a left handed 82 y.o. 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  Parkinson's Disease in a patient with unilateral left hand resting tremor, loss of sense of smell, imbalance, dream enactment, constipation, shuffling gait, short steps, decreased arm swing (R > L), slowness of the movements, feeling of stiffness: Patient reports having a left sided tremor and imbalance since about 2021. He reports it is hard for him to stand for a long time. Patient reports loss of sense of smell in about 2005 and constipation since about 2015 (Miralax and dulcolax). Patient also reports pulling sensation to the left. Patient reports he has always been soft-spoken. Patient reports no family history of Parkinson's disease.  At initial evaluation Mr. Stocking denied decreased volume of speech, drooling, difficulty swallowing, decreased facial expressions, small hand writing, hunched forward posture, transient visual hallucination, depression, anxiety, urinary urgency, sexual dysfunction, drop in blood pressure with change in posture etc.   PRECAUTIONS: Fall  WEIGHT BEARING RESTRICTIONS: No  PAIN:  Are you having pain? 5/10, L ankle; intermittent arthritic pain.  Rest effective to reduce soreness.  FALLS: Has patient fallen in last 6 months? No  LIVING ENVIRONMENT: Lives with: lives with their family,  spouse and daughter Lives in: 2 level home Stairs: Yes: Internal: 1 flight steps; on left going up and External: 2 steps; on left going up Has following equipment at home: None  PLOF: Independent  PATIENT GOALS: Improve balance  OBJECTIVE:   HAND DOMINANCE: Left  ADLs: Overall ADLs: indep Transfers/ambulation related  to ADLs: indep Eating: able to cut own food Grooming: indep UB Dressing: indep LB Dressing: indep Toileting: modified indep with elevated toilet seat Bathing: indep Tub Shower transfers: indep with walk in shower Equipment: none  IADLs: Shopping: spouse manages  Light housekeeping: pt participates in light housekeeping, sweeping, vacuuming Meal Prep: spouse manages at main meals at baseline; pt can make his tea or coffee  Community mobility: fatigues with community mobility  Medication management: pt/spouse report pt is indep with medications  Financial management: pt and spouse manage together  Handwriting: 100% legible and Increased time  MOBILITY STATUS:  Shuffling of gait; pt endorses some occasional freezing, decreased arm swing bilaterally but worse on the R  POSTURE COMMENTS:  rounded shoulders Sitting balance: Moves/returns truncal midpoint >2 inches in all planes  ACTIVITY TOLERANCE: Activity tolerance: Pt reports fatigue with community mobility.   FUNCTIONAL OUTCOME MEASURES: FOTO: 51; predicted 62 , 10th visit score:  65  UPPER EXTREMITY ROM:  BUEs WFL  UPPER EXTREMITY MMT:   BUEs 5/5  HAND FUNCTION: Grip strength: Right: 53 lbs; Left: 40 lbs, Lateral pinch: Right: 20 lbs, Left: 21 lbs, and 3 point pinch: Right: 14 lbs, Left: 14 lbs  COORDINATION: 9 Hole Peg test: Right: 42 sec; Left: 37 sec  5x STS: 15 sec (>16 sec is fall risk) BERG: 49/56 (low fall risk); difficulty with tandem stance (unable to hold heel to toe), forward reach (9"), single leg stance (R leg 3 sec), and turning to the R 360* (5 sec)  6 MWT: Will plan to complete next session  SENSATION: To be further assessed  MUSCLE TONE: Pt endorses some general stiffness; no rigidity noted   COGNITION: Overall cognitive status: decreased short term memory, increased time for processing information (increased time for 1 and 2 step commands)  VISION: Subjective report: wears glasses all the time,  bifocals, hx of cataracts removed both sides   PERCEPTION: WFL  PRAXIS: Impaired: Motor planning; FOGQ: 5/24 (minimal freezing of gait)  OBSERVATIONS: Noted shuffling gait, resting LUE tremor, decreased arm swing bilaterally but R>L  TODAY'S TREATMENT:                                                                                                                              DATE:  LSVT: Self Care: Handwriting: Pt initiated his own short sentence without copying from OT to assess word spacing.  Pt able to initiate good word spacing throughout sentence for 5 repetitions of same sentence.  Neuro re-ed: Patient seen for LSVT Daily Session Maximal Daily Exercises for facilitation/coordination of movement Maximum Sustained Movements are designed to rescale the amplitude  of movement output for generalization to daily functional activities. Performed as follows for 1 set of 10 repetitions each multi-directional sustained movements: 1) Floor to ceiling  2) Side to side multidirectional   Repetitive movements performed in standing and are designed to provide retraining effort needed for sustained muscle activation in tasks. Performed as follows for 1 set of 10 repetitions each of multi-directional repetitive movements: 3) Step and reach forward step 4) Step and reach sideways step 5) Step and reach backwards step 6) Rock and reach forward/backward  7) Rock and reach sideways   Functional Component Task: Sit to stand BIG functional component task with supervision 10 reps Threading zipper: 5 reps, extra time, set up assist for spatial orientation 3. Picking an object up from floor: pt picked up 5 cones from floor; indep 4. Transferring from low chair 5. TBD  Hierarchy task: Handwriting: see above. 2. Stair negotiation (up/down 1 flight with hand rail): pt practiced negotiating up/down 1 step leading with L foot x5, (SBA and without use of L hand rail).    BIG ambulation:  134ft x5 with  max verbal and visual cues for increasing arm swing, especially on the R arm posteriorly.  Intermittent cues for bigger foot clearance.  Practiced bilat arm swing with pt given target to touch the wall behind him on the posterior arm swing, and OT hands on the anterior arm swing to achieve Big arm swing.  Pt completed 3 sets 10 reps each with min vc and visual cues to complete.  Cone drills:  Pt stepped over 5 cones x10 reps without knocking down cones, repeated trials needed and max verbal and visual cues to reduce knocking over cones.   Seated marches: 3 sets 10 reps using hands as target for knee lift.  Single leg stance with min-mod tactile cues for WS to the L when standing on L leg; pt requires single handed UE support.  Pt reached for cones to facilitate increased WS.  PATIENT EDUCATION: Education details: maximal daily exercises Person educated: Patient and Spouse Education method: Explanation, vc, tactile cues, demo, written handout Education comprehension: verbalized understanding, further training needed  HOME EXERCISE PROGRAM: Maximal daily exercises 2x daily   GOALS: Goals reviewed with patient? yes  SHORT TERM GOALS: Target date: 10/30/22 (2 weeks)  Pt will perform maximal daily exercises with written handouts and mod vc from spouse. Baseline: Will initiate next session, 10TH VISIT:  continues to require assist with select exercises for balance Goal status: ONGOING  2.  Pt will complete 360 turn test R/L in 4 sec or less to decrease fall risk with directional changes. Baseline: R 5 sec, L 4 sec, 10th visit:  4 sec right, 4 sec left  Goal status: MET    LONG TERM GOALS: Target date: 11/13/22 (4 weeks)  Pt will increase FOTO score by 5 or more points to indicate clinically relevant improvement in daily tasks. Baseline: Eval: 51; predicted 62, 10TH visit: 65 Goal status: MET  2.  Pt will demo increased tolerance for community mobility as noted by increase in 6 MWT by  100 ft. Baseline: Eval NT d/t time constraints: To be performed next session; 10/20/22: 1,302 ft, 10th visit: 1355 feet  Goal status: ONGOING   3.  Pt will demo increased balance with ADLs and mobility as noted by increase in BERG balance score by 3 points) Baseline: Eval: 49/56, 10th visit:  Goal status: ONGOING  4.  Pt will easily reach to floor to pick up  an ADL item with indep Baseline: Eval: supv to pick up pen from floor (increased time, effort, wide BOS), 10th visit: continues to require increased time, effort and larger BOS Goal status: ONGOING  5.  Pt will be modified indep to negotiate flight of steps with use of handrail. Baseline: Pt avoids 2nd floor of his home, 10th visit: remains uncomfortable with stairs Goal status: ONGOING    ASSESSMENT:  CLINICAL IMPRESSION: Pt continues to be inconsistent with performance of maximal daily exercises 2x daily.  Pt states that he "tried some" this morning, but remains inconsistent to attempt all exercises 2x daily.  OT continues to encourage performance of exercises 2x daily to maximize physical benefits from program.  Pt continues to require visual demonstration to sequence through maximal daily exercises, and max A to maintain rep count with each d/t difficulty with dual tasking.  Pt is improving with coordination of arm swing with rocking and transitions between R/L when performing forward rock and reach and side ways rock and reach, min tactile cues for both.  With big walking, pt continues to struggle to maintain simultaneous big arm swing with big steps (1 or the other), and posterior arm swing is not maintained.  Pt continues to benefit from the tactile cue of the wall to maximize big arm swing, and the visual cue of the cones to maximize foot clearance and step length for big walking.  Pt will continue to benefit from skilled OT with participation in [place] program to increase amplitude of movements and kinesthetic awareness in order to  maximize balance, endurance, and strength for ADLs and mobility.   PERFORMANCE DEFICITS: in functional skills including IADLs, coordination, dexterity, proprioception, strength, flexibility, Fine motor control, Gross motor control, mobility, balance, body mechanics, endurance, decreased knowledge of use of DME, and UE functional use, cognitive skills including memory, safety awareness, and understand, and psychosocial skills including habits and routines and behaviors.   IMPAIRMENTS: are limiting patient from IADLs, rest and sleep, and leisure.   CO-MORBIDITIES: may have co-morbidities  that affects occupational performance. Patient will benefit from skilled OT to address above impairments and improve overall function.  MODIFICATION OR ASSISTANCE TO COMPLETE EVALUATION: No modification of tasks or assist necessary to complete an evaluation.  OT OCCUPATIONAL PROFILE AND HISTORY: Problem focused assessment: Including review of records relating to presenting problem.  CLINICAL DECISION MAKING: Moderate - several treatment options, min-mod task modification necessary  REHAB POTENTIAL: Good  EVALUATION COMPLEXITY: Moderate    PLAN:  OT FREQUENCY: eval on 10/15/21, will begin 4x/week on 10/20/22  OT DURATION: 4 weeks  PLANNED INTERVENTIONS: self care/ADL training, therapeutic exercise, therapeutic activity, neuromuscular re-education, manual therapy, gait training, balance training, stair training, functional mobility training, patient/family education, cognitive remediation/compensation, energy conservation, coping strategies training, and DME and/or AE instructions  RECOMMENDED OTHER SERVICES: N/A  CONSULTED AND AGREED WITH PLAN OF CARE: Patient and family member/caregiver  PLAN FOR NEXT SESSION: Maximal Daily Exercises, continued focus on functional balance  Leta Speller, MS, OTR/L   Darleene Cleaver, OT 11/04/2022, 11:50 AM

## 2022-11-06 NOTE — Therapy (Signed)
OUTPATIENT OCCUPATIONAL THERAPY LSVT BIG NEURO TREATMENT Patient Name: Carlos Morrison MRN: [id number] DOB:09-18-1941, 82 y.o., male Today's Date: 11/02/2022  PCP: Dr. Tracie Harrier REFERRING PROVIDER: Dr. Jennings Books  END OF SESSION:  OT End of Session - 11/06/22 0938     Visit Number 13    Number of Visits 17    Date for OT Re-Evaluation 11/13/22    Authorization Time Period Reporting period beginning 11/03/22    Progress Note Due on Visit 10    OT Start Time 1300    OT Stop Time 1400    OT Time Calculation (min) 60 min    Equipment Utilized During Treatment none    Activity Tolerance Patient tolerated treatment well    Behavior During Therapy WFL for tasks assessed/performed             Past Medical History:  Diagnosis Date   Arthritis    Bilateral knees   GERD (gastroesophageal reflux disease)    Hyperlipidemia    Past Surgical History:  Procedure Laterality Date   CATARACT EXTRACTION W/PHACO Left 03/30/2018   Procedure: CATARACT EXTRACTION PHACO AND INTRAOCULAR LENS PLACEMENT ([place])  LEFT;  Surgeon: Leandrew Koyanagi, MD;  Location: [facility];  Service: Ophthalmology;  Laterality: Left;   CATARACT EXTRACTION W/PHACO Right 01/29/2021   Procedure: CATARACT EXTRACTION PHACO AND INTRAOCULAR LENS PLACEMENT ([place]) RIGHT;  Surgeon: Leandrew Koyanagi, MD;  Location: [facility];  Service: Ophthalmology;  Laterality: Right;  3.24 1:03.0 5.18%   COLONOSCOPY  2011   Dr. Bobbye Charleston   COLONOSCOPY N/A 05/01/2015   Procedure: COLONOSCOPY;  Surgeon: Robert Bellow, MD;  Location: [facility] ENDOSCOPY;  Service: Endoscopy;  Laterality: N/A;   GANGLION CYST EXCISION Left 08/2006   Patient Active Problem List   Diagnosis Date Noted   CN (constipation) 03/19/2015    ONSET DATE: 2 years  REFERRING DIAG: Parkinson's Disease  THERAPY DIAG:  Muscle weakness (generalized)  Other lack of coordination  Other abnormalities of gait and mobility  Rationale for  Evaluation and Treatment: Rehabilitation  SUBJECTIVE:  SUBJECTIVE STATEMENT: Pt reports L ankle is doing fine today at the moment.  PERTINENT HISTORY: Per note from Dr. Manuella Ghazi on 09/22/22 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  Parkinson's Disease in a patient with unilateral left hand resting tremor, loss of sense of smell, imbalance, dream enactment, constipation, shuffling gait, short steps, decreased arm swing (R > L), slowness of the movements, feeling of stiffness: Patient reports having a left sided tremor and imbalance since about 2021. He reports it is hard for him to stand for a long time. Patient reports loss of sense of smell in about 2005 and constipation since about 2015 (Miralax and dulcolax). Patient also reports pulling sensation to the left. Patient reports he has always been soft-spoken. Patient reports no family history of Parkinson's disease.  At initial evaluation [name] denied decreased volume of speech, drooling, difficulty swallowing, decreased facial expressions, small hand writing, hunched forward posture, transient visual hallucination, depression, anxiety, urinary urgency, sexual dysfunction, drop in blood pressure with change in posture etc.   PRECAUTIONS: Fall  WEIGHT BEARING RESTRICTIONS: No  PAIN:  Are you having pain? 0/10, L ankle; intermittent arthritic pain.  Rest effective to reduce soreness.  FALLS: Has patient fallen in last 6 months? No  LIVING ENVIRONMENT: Lives with: lives with their family, spouse and daughter Lives in: 2 level home Stairs: Yes:  Internal: 1 flight steps; on left going up and External: 2 steps; on left going up Has following equipment at home: None  PLOF: Independent  PATIENT GOALS: Improve balance  OBJECTIVE:   HAND DOMINANCE: Left  ADLs: Overall ADLs: indep Transfers/ambulation related to ADLs: indep Eating: able to cut own food Grooming:  indep UB Dressing: indep LB Dressing: indep Toileting: modified indep with elevated toilet seat Bathing: indep Tub Shower transfers: indep with walk in shower Equipment: none  IADLs: Shopping: spouse manages  Light housekeeping: pt participates in light housekeeping, sweeping, vacuuming Meal Prep: spouse manages at main meals at baseline; pt can make his tea or coffee  Community mobility: fatigues with community mobility  Medication management: pt/spouse report pt is indep with medications  Financial management: pt and spouse manage together  Handwriting: 100% legible and Increased time  MOBILITY STATUS:  Shuffling of gait; pt endorses some occasional freezing, decreased arm swing bilaterally but worse on the R  POSTURE COMMENTS:  rounded shoulders Sitting balance: Moves/returns truncal midpoint >2 inches in all planes  ACTIVITY TOLERANCE: Activity tolerance: Pt reports fatigue with community mobility.   FUNCTIONAL OUTCOME MEASURES: FOTO: 51; predicted 62 , 10th visit score:  65  UPPER EXTREMITY ROM:  BUEs WFL  UPPER EXTREMITY MMT:   BUEs 5/5  HAND FUNCTION: Grip strength: Right: 53 lbs; Left: 40 lbs, Lateral pinch: Right: 20 lbs, Left: 21 lbs, and 3 point pinch: Right: 14 lbs, Left: 14 lbs  COORDINATION: 9 Hole Peg test: Right: 42 sec; Left: 37 sec  5x STS: 15 sec (>16 sec is fall risk) BERG: 49/56 (low fall risk); difficulty with tandem stance (unable to hold heel to toe), forward reach (9"), single leg stance (R leg 3 sec), and turning to the R 360* (5 sec)  6 MWT: Will plan to complete next session  SENSATION: To be further assessed  MUSCLE TONE: Pt endorses some general stiffness; no rigidity noted   COGNITION: Overall cognitive status: decreased short term memory, increased time for processing information (increased time for 1 and 2 step commands)  VISION: Subjective report: wears glasses all the time, bifocals, hx of cataracts removed both sides    PERCEPTION: WFL  PRAXIS: Impaired: Motor planning; FOGQ: 5/24 (minimal freezing of gait)  OBSERVATIONS: Noted shuffling gait, resting LUE tremor, decreased arm swing bilaterally but R>L  TODAY'S TREATMENT:                                                                                                                              DATE:  LSVT: Self Care: Handwriting: Pt initiated his own short sentence without copying from OT to assess word spacing.  Pt able to initiate good word spacing with 3 of 5 repetitions of same sentence.  Neuro re-ed: Patient seen for LSVT Daily Session Maximal Daily Exercises for facilitation/coordination of movement Maximum Sustained Movements are designed to rescale the amplitude of movement output for generalization to daily functional activities. Performed  as follows for 1 set of 10 repetitions each multi-directional sustained movements: 1) Floor to ceiling  2) Side to side multidirectional   Repetitive movements performed in standing and are designed to provide retraining effort needed for sustained muscle activation in tasks. Performed as follows for 1 set of 10 repetitions each of multi-directional repetitive movements: 3) Step and reach forward step 4) Step and reach sideways step 5) Step and reach backwards step 6) Rock and reach forward/backward  7) Rock and reach sideways   Functional Component Task: Sit to stand BIG functional component task with supervision 10 reps Threading zipper: 5 reps, extra time, set up assist for spatial orientation 3. Picking an object up from floor: pt picked up 5 cones from floor; indep 4. Transferring from low chair 5. TBD  Hierarchy task: Handwriting: see above. 2. Stair negotiation (up/down 1 flight with hand rail): pt practiced negotiating up/down 1 step leading with L foot x5, (SBA and without use of L hand rail).    BIG ambulation:  129ft x3 with max verbal and visual cues for increasing arm swing,  especially on the R arm posteriorly.  Intermittent cues for bigger foot clearance.  Practiced bilat arm swing with pt given target to touch the wall behind him on the posterior arm swing, and OT hands on the anterior arm swing to achieve Big arm swing.  Pt completed 3 sets 10 reps each with min vc and visual cues to complete.  Cone drills:  Pt stepped over 5 cones x10 reps without knocking down cones, repeated trials needed and max verbal and visual cues to reduce knocking over cones.  Able to perform with intermittent min guard but mostly close supv.  Single leg stance with min-mod tactile cues for WS to the L when standing on L leg and use of chair for support.  Pt required single handed UE support but was able to progress to 1 finger and then no UE support after repetition on each leg.  Encouraged pt use chair or countertop for support at home.  PATIENT EDUCATION: Education details: maximal daily exercises Person educated: Patient and Spouse Education method: Explanation, vc, tactile cues, demo, written handout Education comprehension: verbalized understanding, further training needed  HOME EXERCISE PROGRAM: Maximal daily exercises 2x daily   GOALS: Goals reviewed with patient? yes  SHORT TERM GOALS: Target date: 10/30/22 (2 weeks)  Pt will perform maximal daily exercises with written handouts and mod vc from spouse. Baseline: Will initiate next session, 10TH VISIT:  continues to require assist with select exercises for balance Goal status: ONGOING  2.  Pt will complete 360 turn test R/L in 4 sec or less to decrease fall risk with directional changes. Baseline: R 5 sec, L 4 sec, 10th visit:  4 sec right, 4 sec left  Goal status: MET    LONG TERM GOALS: Target date: 11/13/22 (4 weeks)  Pt will increase FOTO score by 5 or more points to indicate clinically relevant improvement in daily tasks. Baseline: Eval: 51; predicted 62, 10TH visit: 65 Goal status: MET  2.  Pt will demo  increased tolerance for community mobility as noted by increase in 6 MWT by 100 ft. Baseline: Eval NT d/t time constraints: To be performed next session; 10/20/22: 1,302 ft, 10th visit: 1355 feet  Goal status: ONGOING   3.  Pt will demo increased balance with ADLs and mobility as noted by increase in BERG balance score by 3 points) Baseline: Eval: 49/56, 10th visit:  Goal  status: ONGOING  4.  Pt will easily reach to floor to pick up an ADL item with indep Baseline: Eval: supv to pick up pen from floor (increased time, effort, wide BOS), 10th visit: continues to require increased time, effort and larger BOS Goal status: ONGOING  5.  Pt will be modified indep to negotiate flight of steps with use of handrail. Baseline: Pt avoids 2nd floor of his home, 10th visit: remains uncomfortable with stairs Goal status: ONGOING    ASSESSMENT:  CLINICAL IMPRESSION: Pt continues to be inconsistent with performance of maximal daily exercises 2x daily.  OT continues to encourage performance of exercises 2x daily to maximize physical benefits from program.  Pt continues to require visual demonstration to sequence through maximal daily exercises, and max A to maintain rep count with each d/t difficulty with dual tasking.  Pt is improving with coordination of arm swing with rocking and transitions between R/L when performing forward rock and reach and side ways rock and reach, min tactile cues for both.  With big walking, pt continues to struggle to maintain simultaneous big arm swing with big steps (1 or the other), and posterior arm swing is not maintained.  Pt continues to benefit from the tactile cue of the wall to maximize big arm swing, and the visual cue of the cones to maximize foot clearance and step length for big walking.  Focussed on single leg stance with min-mod tactile cues for WS to the L when standing on L leg and use of chair for support.  Pt required single handed UE support but was able to  progress to 1 finger and then no UE support after repetition on each leg.  Pt was able to stand 3-5 sec on L leg without UE support, and up to 5 sec on the R.  Encouraged pt use chair or countertop for support at home.  Pt will continue to benefit from skilled OT with participation in LSVT BIG program to increase amplitude of movements and kinesthetic awareness in order to maximize balance, endurance, and strength for ADLs and mobility.   PERFORMANCE DEFICITS: in functional skills including IADLs, coordination, dexterity, proprioception, strength, flexibility, Fine motor control, Gross motor control, mobility, balance, body mechanics, endurance, decreased knowledge of use of DME, and UE functional use, cognitive skills including memory, safety awareness, and understand, and psychosocial skills including habits and routines and behaviors.   IMPAIRMENTS: are limiting patient from IADLs, rest and sleep, and leisure.   CO-MORBIDITIES: may have co-morbidities  that affects occupational performance. Patient will benefit from skilled OT to address above impairments and improve overall function.  MODIFICATION OR ASSISTANCE TO COMPLETE EVALUATION: No modification of tasks or assist necessary to complete an evaluation.  OT OCCUPATIONAL PROFILE AND HISTORY: Problem focused assessment: Including review of records relating to presenting problem.  CLINICAL DECISION MAKING: Moderate - several treatment options, min-mod task modification necessary  REHAB POTENTIAL: Good  EVALUATION COMPLEXITY: Moderate    PLAN:  OT FREQUENCY: eval on 10/15/21, will begin 4x/week on 10/20/22  OT DURATION: 4 weeks  PLANNED INTERVENTIONS: self care/ADL training, therapeutic exercise, therapeutic activity, neuromuscular re-education, manual therapy, gait training, balance training, stair training, functional mobility training, patient/family education, cognitive remediation/compensation, energy conservation, coping strategies  training, and DME and/or AE instructions  RECOMMENDED OTHER SERVICES: N/A  CONSULTED AND AGREED WITH PLAN OF CARE: Patient and family member/caregiver  PLAN FOR NEXT SESSION: Maximal Daily Exercises, continued focus on functional balance  Danelle Earthly, MS, OTR/L   Harriett Sine  Debbora Lacrosse, OT 11/06/2022, 9:39 AM

## 2022-11-09 ENCOUNTER — Ambulatory Visit: Payer: Medicare Other

## 2022-11-09 DIAGNOSIS — R2689 Other abnormalities of gait and mobility: Secondary | ICD-10-CM

## 2022-11-09 DIAGNOSIS — M6281 Muscle weakness (generalized): Secondary | ICD-10-CM

## 2022-11-09 DIAGNOSIS — R278 Other lack of coordination: Secondary | ICD-10-CM | POA: Diagnosis not present

## 2022-11-10 ENCOUNTER — Ambulatory Visit: Payer: Medicare Other

## 2022-11-10 DIAGNOSIS — R278 Other lack of coordination: Secondary | ICD-10-CM | POA: Diagnosis not present

## 2022-11-10 DIAGNOSIS — R2689 Other abnormalities of gait and mobility: Secondary | ICD-10-CM

## 2022-11-10 DIAGNOSIS — M6281 Muscle weakness (generalized): Secondary | ICD-10-CM

## 2022-11-10 NOTE — Therapy (Signed)
OUTPATIENT OCCUPATIONAL THERAPY [facility]T BIG NEURO TREATMENT Patient Name: Carlos Morrison MRN: [id number] DOB:Dec 18, 1940, 82 y.o., male Today's Date: 11/02/2022  PCP: Dr. Tracie Harrier REFERRING PROVIDER: Dr. Jennings Books  END OF SESSION:  OT End of Session - 11/10/22 0841     Visit Number 14    Number of Visits 17    Date for OT Re-Evaluation 11/13/22    Authorization Time Period Reporting period beginning 11/03/22    Progress Note Due on Visit 10    OT Start Time 1300    OT Stop Time 1400    OT Time Calculation (min) 60 min    Equipment Utilized During Treatment none    Activity Tolerance Patient tolerated treatment well    Behavior During Therapy WFL for tasks assessed/performed             Past Medical History:  Diagnosis Date   Arthritis    Bilateral knees   GERD (gastroesophageal reflux disease)    Hyperlipidemia    Past Surgical History:  Procedure Laterality Date   CATARACT EXTRACTION W/PHACO Left 03/30/2018   Procedure: CATARACT EXTRACTION PHACO AND INTRAOCULAR LENS PLACEMENT ([place])  LEFT;  Surgeon: Leandrew Koyanagi, MD;  Location: [facility];  Service: Ophthalmology;  Laterality: Left;   CATARACT EXTRACTION W/PHACO Right 01/29/2021   Procedure: CATARACT EXTRACTION PHACO AND INTRAOCULAR LENS PLACEMENT ([place]) RIGHT;  Surgeon: Leandrew Koyanagi, MD;  Location: [place];  Service: Ophthalmology;  Laterality: Right;  3.24 1:03.0 5.18%   COLONOSCOPY  2011   Dr. Bobbye Charleston   COLONOSCOPY N/A 05/01/2015   Procedure: COLONOSCOPY;  Surgeon: Robert Bellow, MD;  Location: [facility];  Service: Endoscopy;  Laterality: N/A;   GANGLION CYST EXCISION Left 08/2006   Patient Active Problem List   Diagnosis Date Noted   CN (constipation) 03/19/2015    ONSET DATE: 2 years  REFERRING DIAG: Parkinson's Disease  THERAPY DIAG:  Muscle weakness (generalized)  Other lack of coordination  Other abnormalities of gait and mobility  Rationale for  Evaluation and Treatment: Rehabilitation  SUBJECTIVE:  SUBJECTIVE STATEMENT: Pt reports that he did work on his maximal daily exercises over the weekend, as well as this morning.  PERTINENT HISTORY: Per note from Dr. Manuella Ghazi on 09/22/22 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  Parkinson's Disease in a patient with unilateral left hand resting tremor, loss of sense of smell, imbalance, dream enactment, constipation, shuffling gait, short steps, decreased arm swing (R > L), slowness of the movements, feeling of stiffness: Patient reports having a left sided tremor and imbalance since about 2021. He reports it is hard for him to stand for a long time. Patient reports loss of sense of smell in about 2005 and constipation since about 2015 (Miralax and dulcolax). Patient also reports pulling sensation to the left. Patient reports he has always been soft-spoken. Patient reports no family history of Parkinson's disease.  At initial evaluation [name] denied decreased volume of speech, drooling, difficulty swallowing, decreased facial expressions, small hand writing, hunched forward posture, transient visual hallucination, depression, anxiety, urinary urgency, sexual dysfunction, drop in blood pressure with change in posture etc.   PRECAUTIONS: Fall  WEIGHT BEARING RESTRICTIONS: No  PAIN:  Are you having pain? 0/10, L ankle; intermittent arthritic pain.  Rest effective to reduce soreness.  FALLS: Has patient fallen in last 6 months? No  LIVING ENVIRONMENT: Lives with: lives with their family, spouse and  daughter Lives in: 2 level home Stairs: Yes: Internal: 1 flight steps; on left going up and External: 2 steps; on left going up Has following equipment at home: None  PLOF: Independent  PATIENT GOALS: Improve balance  OBJECTIVE:   HAND DOMINANCE: Left  ADLs: Overall ADLs: indep Transfers/ambulation related to ADLs:  indep Eating: able to cut own food Grooming: indep UB Dressing: indep LB Dressing: indep Toileting: modified indep with elevated toilet seat Bathing: indep Tub Shower transfers: indep with walk in shower Equipment: none  IADLs: Shopping: spouse manages  Light housekeeping: pt participates in light housekeeping, sweeping, vacuuming Meal Prep: spouse manages at main meals at baseline; pt can make his tea or coffee  Community mobility: fatigues with community mobility  Medication management: pt/spouse report pt is indep with medications  Financial management: pt and spouse manage together  Handwriting: 100% legible and Increased time  MOBILITY STATUS:  Shuffling of gait; pt endorses some occasional freezing, decreased arm swing bilaterally but worse on the R  POSTURE COMMENTS:  rounded shoulders Sitting balance: Moves/returns truncal midpoint >2 inches in all planes  ACTIVITY TOLERANCE: Activity tolerance: Pt reports fatigue with community mobility.   FUNCTIONAL OUTCOME MEASURES: FOTO: 51; predicted 62 , 10th visit score:  65  UPPER EXTREMITY ROM:  BUEs WFL  UPPER EXTREMITY MMT:   BUEs 5/5  HAND FUNCTION: Grip strength: Right: 53 lbs; Left: 40 lbs, Lateral pinch: Right: 20 lbs, Left: 21 lbs, and 3 point pinch: Right: 14 lbs, Left: 14 lbs  COORDINATION: 9 Hole Peg test: Right: 42 sec; Left: 37 sec  5x STS: 15 sec (>16 sec is fall risk) BERG: 49/56 (low fall risk); difficulty with tandem stance (unable to hold heel to toe), forward reach (9"), single leg stance (R leg 3 sec), and turning to the R 360* (5 sec)  6 MWT: Will plan to complete next session  SENSATION: To be further assessed  MUSCLE TONE: Pt endorses some general stiffness; no rigidity noted   COGNITION: Overall cognitive status: decreased short term memory, increased time for processing information (increased time for 1 and 2 step commands)  VISION: Subjective report: wears glasses all the time, bifocals,  hx of cataracts removed both sides   PERCEPTION: WFL  PRAXIS: Impaired: Motor planning; FOGQ: 5/24 (minimal freezing of gait)  OBSERVATIONS: Noted shuffling gait, resting LUE tremor, decreased arm swing bilaterally but R>L  TODAY'S TREATMENT:                                                                                                                              DATE:  LSVT: Self Care: Handwriting: Pt initiated his own short sentence.  Pt able to initiate good word spacing with 5 repetitions of same sentence.  Encouraged pt continue to maintain handwriting post d/c by writing daily; examples provided with helping spouse to write out a grocery list, writing out the date, to-do lists, writing checks, or filling out shorter medical forms  at appointments.  Pt/spouse verbalized understanding.  Neuro re-ed: Patient seen for LSVT Daily Session Maximal Daily Exercises for facilitation/coordination of movement Maximum Sustained Movements are designed to rescale the amplitude of movement output for generalization to daily functional activities. Performed as follows for 1 set of 10 repetitions each multi-directional sustained movements: 1) Floor to ceiling  2) Side to side multidirectional   Repetitive movements performed in standing and are designed to provide retraining effort needed for sustained muscle activation in tasks. Performed as follows for 1 set of 10 repetitions each of multi-directional repetitive movements: 3) Step and reach forward step 4) Step and reach sideways step 5) Step and reach backwards step 6) Rock and reach forward/backward  7) Rock and reach sideways   Functional Component Task: Sit to stand BIG functional component task with supervision 10 reps Threading zipper: 5 reps, extra time, set up assist for spatial orientation; pt completes in standing  3. Picking an object up from floor: pt picks up 5 cones from floor; indep 4. Transferring from low chair 5.  TBD  Hierarchy task: Handwriting: see above. 2. Stair negotiation (up/down 1 flight with hand rail): pt negotiated 1 flight (13 steps) using L handrail.  Extra time.  Pt alternates feet on 50% of steps while ascending.  Pt uses "step-to" pattern to descend.    BIG ambulation:  144ft x3 with max verbal and visual cues for increasing arm swing, especially on the R arm posteriorly.  Intermittent cues for bigger foot clearance.  Practiced bilat arm swing with pt given target to touch the wall behind him on the posterior arm swing, and OT hands on the anterior arm swing to achieve Big arm swing.  Pt completed 3 sets 10 reps each with min vc and visual cues to complete.  Cone drills:  Pt stepped over 5 cones x10 reps without knocking down cones, repeated trials needed and mod verbal and visual cues to reduce knocking over cones.  Able to perform with intermittent min guard but mostly close supv.  Completed standing marches tapping top of cone with each march x3 sets 10 reps (5 trials needed to make it through 3 full sets without knocking down a cone).  PATIENT EDUCATION: Education details: maximal daily exercises, daily cognitive activities  Person educated: Patient and Spouse Education method: Explanation, vc, tactile cues, demo, written handout Education comprehension: verbalized understanding, further training needed  HOME EXERCISE PROGRAM: Maximal daily exercises 2x daily   GOALS: Goals reviewed with patient? yes  SHORT TERM GOALS: Target date: 10/30/22 (2 weeks)  Pt will perform maximal daily exercises with written handouts and mod vc from spouse. Baseline: Will initiate next session, 10TH VISIT:  continues to require assist with select exercises for balance Goal status: ONGOING  2.  Pt will complete 360 turn test R/L in 4 sec or less to decrease fall risk with directional changes. Baseline: R 5 sec, L 4 sec, 10th visit:  4 sec right, 4 sec left  Goal status: MET    LONG TERM  GOALS: Target date: 11/13/22 (4 weeks)  Pt will increase FOTO score by 5 or more points to indicate clinically relevant improvement in daily tasks. Baseline: Eval: 51; predicted 62, 10TH visit: 65 Goal status: MET  2.  Pt will demo increased tolerance for community mobility as noted by increase in 6 MWT by 100 ft. Baseline: Eval NT d/t time constraints: To be performed next session; 10/20/22: 1,302 ft, 10th visit: 1355 feet  Goal status: ONGOING  3.  Pt will demo increased balance with ADLs and mobility as noted by increase in BERG balance score by 3 points) Baseline: Eval: 49/56, 10th visit:  Goal status: ONGOING  4.  Pt will easily reach to floor to pick up an ADL item with indep Baseline: Eval: supv to pick up pen from floor (increased time, effort, wide BOS), 10th visit: continues to require increased time, effort and larger BOS Goal status: ONGOING  5.  Pt will be modified indep to negotiate flight of steps with use of handrail. Baseline: Pt avoids 2nd floor of his home, 10th visit: remains uncomfortable with stairs Goal status: ONGOING    ASSESSMENT:  CLINICAL IMPRESSION: Pt reports that he did work on his maximal daily exercises at home over the weekend and this morning.  Pt still working to consistently perform these 2x daily.  Pt continues to require visual demonstration to sequence through maximal daily exercises, and max A to maintain rep count with each d/t difficulty with dual tasking.  Pt is improving with coordination of arm swing with rocking and transitions between R/L when performing forward rock and reach and side ways rock and reach, min tactile cues for both to initiate correct movements.  With big walking, pt continues to struggle to maintain simultaneous big arm swing with big steps (1 or the other), and posterior arm swing is not maintained.  Pt is improving with cone drills, requiring fewer trials to complete a set without knocking down cones.  Pt negotiated 1 flight  of stairs with close supv and use of L railing, though L knee soreness (only on stairs today) and LE weakness contribute to increased time and a "step-to" pattern rather than alternating legs.  Pt continues to benefit from the tactile cue of the wall to maximize big arm swing, and the visual cue of the cones to maximize foot clearance and step length for big walking.  Pt and spouse receptive to recommendations for carryover activities to provide daily cognitive and writing tasks, including completion of puzzles, date orientation, handwriting.  Pt will continue to benefit from skilled OT with participation in [place] program to increase amplitude of movements and kinesthetic awareness in order to maximize balance, endurance, and strength for ADLs and mobility.   PERFORMANCE DEFICITS: in functional skills including IADLs, coordination, dexterity, proprioception, strength, flexibility, Fine motor control, Gross motor control, mobility, balance, body mechanics, endurance, decreased knowledge of use of DME, and UE functional use, cognitive skills including memory, safety awareness, and understand, and psychosocial skills including habits and routines and behaviors.   IMPAIRMENTS: are limiting patient from IADLs, rest and sleep, and leisure.   CO-MORBIDITIES: may have co-morbidities  that affects occupational performance. Patient will benefit from skilled OT to address above impairments and improve overall function.  MODIFICATION OR ASSISTANCE TO COMPLETE EVALUATION: No modification of tasks or assist necessary to complete an evaluation.  OT OCCUPATIONAL PROFILE AND HISTORY: Problem focused assessment: Including review of records relating to presenting problem.  CLINICAL DECISION MAKING: Moderate - several treatment options, min-mod task modification necessary  REHAB POTENTIAL: Good  EVALUATION COMPLEXITY: Moderate    PLAN:  OT FREQUENCY: eval on 10/15/21, will begin 4x/week on 10/20/22  OT DURATION:  4 weeks  PLANNED INTERVENTIONS: self care/ADL training, therapeutic exercise, therapeutic activity, neuromuscular re-education, manual therapy, gait training, balance training, stair training, functional mobility training, patient/family education, cognitive remediation/compensation, energy conservation, coping strategies training, and DME and/or AE instructions  RECOMMENDED OTHER SERVICES: N/A  CONSULTED AND AGREED  WITH PLAN OF CARE: Patient and family member/caregiver  PLAN FOR NEXT SESSION: Maximal Daily Exercises, continued focus on functional balance  Leta Speller, MS, OTR/L   Darleene Cleaver, OT 11/10/2022, 8:42 AM

## 2022-11-11 ENCOUNTER — Ambulatory Visit: Payer: Medicare Other

## 2022-11-11 DIAGNOSIS — R278 Other lack of coordination: Secondary | ICD-10-CM | POA: Diagnosis not present

## 2022-11-11 DIAGNOSIS — M6281 Muscle weakness (generalized): Secondary | ICD-10-CM

## 2022-11-11 DIAGNOSIS — R2689 Other abnormalities of gait and mobility: Secondary | ICD-10-CM

## 2022-11-11 NOTE — Therapy (Addendum)
OUTPATIENT OCCUPATIONAL THERAPY LSVT BIG NEURO TREATMENT Patient Name: Carlos Morrison MRN: [id number] DOB:12-23-1940, 82 y.o., male Today's Date: 11/02/2022  PCP: Dr. Tracie Harrier REFERRING PROVIDER: Dr. Jennings Books  END OF SESSION:  OT End of Session - 11/11/22 1713     Visit Number 16    Number of Visits 17    Date for OT Re-Evaluation 11/13/22    Authorization Time Period Reporting period beginning 11/03/22    Progress Note Due on Visit 10    OT Start Time 1300    OT Stop Time 1400    OT Time Calculation (min) 60 min    Equipment Utilized During Treatment none    Activity Tolerance Patient tolerated treatment well    Behavior During Therapy WFL for tasks assessed/performed             Past Medical History:  Diagnosis Date   Arthritis    Bilateral knees   GERD (gastroesophageal reflux disease)    Hyperlipidemia    Past Surgical History:  Procedure Laterality Date   CATARACT EXTRACTION W/PHACO Left 03/30/2018   Procedure: CATARACT EXTRACTION PHACO AND INTRAOCULAR LENS PLACEMENT ([place])  LEFT;  Surgeon: Leandrew Koyanagi, MD;  Location: [facility];  Service: Ophthalmology;  Laterality: Left;   CATARACT EXTRACTION W/PHACO Right 01/29/2021   Procedure: CATARACT EXTRACTION PHACO AND INTRAOCULAR LENS PLACEMENT ([place]) RIGHT;  Surgeon: Leandrew Koyanagi, MD;  Location: [facility];  Service: Ophthalmology;  Laterality: Right;  3.24 1:03.0 5.18%   COLONOSCOPY  2011   Dr. Bobbye [place]   COLONOSCOPY N/A 05/01/2015   Procedure: COLONOSCOPY;  Surgeon: Robert Bellow, MD;  Location: [facility] ENDOSCOPY;  Service: Endoscopy;  Laterality: N/A;   GANGLION CYST EXCISION Left 08/2006   Patient Active Problem List   Diagnosis Date Noted   CN (constipation) 03/19/2015   ONSET DATE: 2 years  REFERRING DIAG: Parkinson's Disease  THERAPY DIAG:  Muscle weakness (generalized)  Other lack of coordination  Other abnormalities of gait and mobility  Rationale for  Evaluation and Treatment: Rehabilitation  SUBJECTIVE:  SUBJECTIVE STATEMENT: Pt reports having a busy morning doing things around the house.  PERTINENT HISTORY: Per note from Dr. Manuella Ghazi on 09/22/22 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  Parkinson's Disease in a patient with unilateral left hand resting tremor, loss of sense of smell, imbalance, dream enactment, constipation, shuffling gait, short steps, decreased arm swing (R > L), slowness of the movements, feeling of stiffness: Patient reports having a left sided tremor and imbalance since about 2021. He reports it is hard for him to stand for a long time. Patient reports loss of sense of smell in about 2005 and constipation since about 2015 (Miralax and dulcolax). Patient also reports pulling sensation to the left. Patient reports he has always been soft-spoken. Patient reports no family history of Parkinson's disease.  At initial evaluation [name] denied decreased volume of speech, drooling, difficulty swallowing, decreased facial expressions, small hand writing, hunched forward posture, transient visual hallucination, depression, anxiety, urinary urgency, sexual dysfunction, drop in blood pressure with change in posture etc.   PRECAUTIONS: Fall  WEIGHT BEARING RESTRICTIONS: No  PAIN:  Are you having pain? 0/10, L ankle; intermittent arthritic pain.  Rest effective to reduce soreness.  FALLS: Has patient fallen in last 6 months? No  LIVING ENVIRONMENT: Lives with: lives with their family, spouse and daughter Lives in: 2 level home Stairs: Yes: Internal:  1 flight steps; on left going up and External: 2 steps; on left going up Has following equipment at home: None  PLOF: Independent  PATIENT GOALS: Improve balance  OBJECTIVE:   HAND DOMINANCE: Left  ADLs: Overall ADLs: indep Transfers/ambulation related to ADLs: indep Eating: able to cut own  food Grooming: indep UB Dressing: indep LB Dressing: indep Toileting: modified indep with elevated toilet seat Bathing: indep Tub Shower transfers: indep with walk in shower Equipment: none  IADLs: Shopping: spouse manages  Light housekeeping: pt participates in light housekeeping, sweeping, vacuuming Meal Prep: spouse manages at main meals at baseline; pt can make his tea or coffee  Community mobility: fatigues with community mobility  Medication management: pt/spouse report pt is indep with medications  Financial management: pt and spouse manage together  Handwriting: 100% legible and Increased time  MOBILITY STATUS:  Shuffling of gait; pt endorses some occasional freezing, decreased arm swing bilaterally but worse on the R  POSTURE COMMENTS:  rounded shoulders Sitting balance: Moves/returns truncal midpoint >2 inches in all planes  ACTIVITY TOLERANCE: Activity tolerance: Pt reports fatigue with community mobility.   FUNCTIONAL OUTCOME MEASURES: FOTO: 51; predicted 62 , 10th visit score:  65  UPPER EXTREMITY ROM:  BUEs WFL  UPPER EXTREMITY MMT:   BUEs 5/5  HAND FUNCTION: Grip strength: Right: 53 lbs; Left: 40 lbs, Lateral pinch: Right: 20 lbs, Left: 21 lbs, and 3 point pinch: Right: 14 lbs, Left: 14 lbs  COORDINATION: 9 Hole Peg test: Right: 42 sec; Left: 37 sec  5x STS: 15 sec (>16 sec is fall risk) BERG: 49/56 (low fall risk); difficulty with tandem stance (unable to hold heel to toe), forward reach (9"), single leg stance (R leg 3 sec), and turning to the R 360* (5 sec)  6 MWT: Will plan to complete next session  SENSATION: To be further assessed  MUSCLE TONE: Pt endorses some general stiffness; no rigidity noted   COGNITION: Overall cognitive status: decreased short term memory, increased time for processing information (increased time for 1 and 2 step commands)  VISION: Subjective report: wears glasses all the time, bifocals, hx of cataracts removed both  sides   PERCEPTION: WFL  PRAXIS: Impaired: Motor planning; FOGQ: 5/24 (minimal freezing of gait)  OBSERVATIONS: Noted shuffling gait, resting LUE tremor, decreased arm swing bilaterally but R>L  TODAY'S TREATMENT:                                                                                                                              LSVT: Self Care: Handwriting: Pt initiated his own short sentence.  Pt able to initiate good word spacing with 3 of 5 repetitions of same sentence.  Min verbal and vc for maintaining sufficient word spacing to prevent crowding after 1st rep.  Neuro re-ed: Patient seen for LSVT Daily Session Maximal Daily Exercises for facilitation/coordination of movement Maximum Sustained Movements are designed to rescale the amplitude of movement output for generalization  to daily functional activities. Performed as follows for 1 set of 10 repetitions each multi-directional sustained movements: 1) Floor to ceiling  2) Side to side multidirectional   Repetitive movements performed in standing and are designed to provide retraining effort needed for sustained muscle activation in tasks. Performed as follows for 1 set of 10 repetitions each of multi-directional repetitive movements: 3) Step and reach forward step 4) Step and reach sideways step 5) Step and reach backwards step 6) Rock and reach forward/backward  7) Rock and reach sideways   Functional Component Task: Sit to stand BIG functional component task with supervision 10 reps Threading zipper: 5 reps, extra time; pt completes in standing  3. Picking an object up from floor: pt picks up 5 cones from floor; indep 4. Transferring from low chair 5. TBD  Hierarchy task: Handwriting: see above. 2. Stair negotiation: pt negotiated 1 step leading with the L foot, 5 reps without handrail.   BIG ambulation:  191ft x3 with mod verbal and visual cues for increasing arm swing, especially on the R arm posteriorly.   Intermittent cues for bigger foot clearance.  Cone drills:  Completed standing marches tapping top of cone with each march x1 set 10 reps.  Able to complete 3 sets 10 reps without knocking down cones in only 4 attempts.  SLS: Min tactile cues for WS to the L when standing on L leg and use of ledge for support.  Pt required single handed UE support but was able to progress to 1 finger and then no UE support after repetition on each leg.  Continue to encourage pt use chair or countertop for support at home.   PATIENT EDUCATION: Education details: maximal daily exercises, daily cognitive activities  Person educated: Patient and Spouse Education method: Explanation, vc, tactile cues, demo, written handout Education comprehension: verbalized understanding, further training needed  HOME EXERCISE PROGRAM: Maximal daily exercises 2x daily   GOALS: Goals reviewed with patient? yes  SHORT TERM GOALS: Target date: 10/30/22 (2 weeks)  Pt will perform maximal daily exercises with written handouts and mod vc from spouse. Baseline: Will initiate next session, 10TH VISIT:  continues to require assist with select exercises for balance Goal status: ONGOING  2.  Pt will complete 360 turn test R/L in 4 sec or less to decrease fall risk with directional changes. Baseline: R 5 sec, L 4 sec, 10th visit:  4 sec right, 4 sec left  Goal status: MET    LONG TERM GOALS: Target date: 11/13/22 (4 weeks)  Pt will increase FOTO score by 5 or more points to indicate clinically relevant improvement in daily tasks. Baseline: Eval: 51; predicted 62, 10TH visit: 65 Goal status: MET  2.  Pt will demo increased tolerance for community mobility as noted by increase in 6 MWT by 100 ft. Baseline: Eval NT d/t time constraints: To be performed next session; 10/20/22: 1,302 ft, 10th visit: 1355 feet  Goal status: ONGOING   3.  Pt will demo increased balance with ADLs and mobility as noted by increase in BERG balance score by  3 points) Baseline: Eval: 49/56, 10th visit:  Goal status: ONGOING  4.  Pt will easily reach to floor to pick up an ADL item with indep Baseline: Eval: supv to pick up pen from floor (increased time, effort, wide BOS), 10th visit: continues to require increased time, effort and larger BOS Goal status: ONGOING  5.  Pt will be modified indep to negotiate flight of steps with  use of handrail. Baseline: Pt avoids 2nd floor of his home, 10th visit: remains uncomfortable with stairs Goal status: ONGOING    ASSESSMENT:  CLINICAL IMPRESSION: Avoided full flight of stairs again today as pt stated that L knee was irritated last time pt practiced a flight.  Pt is able to lead with the L leg (weaker side) to negotiate up down 1 step x5 reps without use of handrail.  Improved performance with standing marches this date with pt able to complete 3 sets 10 cone taps in 4 attempts without knocking down cones.  Pt continues to require tactile and visual cues for coordinating correct arm and leg movements with MDE, but spouse feels comfortable in assisting pt using handout.  Decreased spatial awareness, memory, and processing skills contribute to ongoing need for caregiver to assist pt to perform MDE accurately.  Planning discharge next visit which will complete 4 weeks of LSVT BIG program.  Pt/spouse in agreement with plan.  PERFORMANCE DEFICITS: in functional skills including IADLs, coordination, dexterity, proprioception, strength, flexibility, Fine motor control, Gross motor control, mobility, balance, body mechanics, endurance, decreased knowledge of use of DME, and UE functional use, cognitive skills including memory, safety awareness, and understand, and psychosocial skills including habits and routines and behaviors.   IMPAIRMENTS: are limiting patient from IADLs, rest and sleep, and leisure.   CO-MORBIDITIES: may have co-morbidities  that affects occupational performance. Patient will benefit from  skilled OT to address above impairments and improve overall function.  MODIFICATION OR ASSISTANCE TO COMPLETE EVALUATION: No modification of tasks or assist necessary to complete an evaluation.  OT OCCUPATIONAL PROFILE AND HISTORY: Problem focused assessment: Including review of records relating to presenting problem.  CLINICAL DECISION MAKING: Moderate - several treatment options, min-mod task modification necessary  REHAB POTENTIAL: Good  EVALUATION COMPLEXITY: Moderate    PLAN:  OT FREQUENCY: eval on 10/15/21, will begin 4x/week on 10/20/22  OT DURATION: 4 weeks  PLANNED INTERVENTIONS: self care/ADL training, therapeutic exercise, therapeutic activity, neuromuscular re-education, manual therapy, gait training, balance training, stair training, functional mobility training, patient/family education, cognitive remediation/compensation, energy conservation, coping strategies training, and DME and/or AE instructions  RECOMMENDED OTHER SERVICES: N/A  CONSULTED AND AGREED WITH PLAN OF CARE: Patient and family member/caregiver  PLAN FOR NEXT SESSION: Maximal Daily Exercises, discharge assessment  Leta Speller, MS, OTR/L   Darleene Cleaver, OT 11/11/2022, 5:14 PM

## 2022-11-11 NOTE — Therapy (Signed)
OUTPATIENT OCCUPATIONAL THERAPY LSVT BIG NEURO TREATMENT Patient Name: Carlos Morrison MRN: [id number] DOB:September 22, 1941, 82 y.o., male Today's Date: 11/02/2022  PCP: Dr. Tracie Harrier REFERRING PROVIDER: Dr. Jennings Books  END OF SESSION:  OT End of Session - 11/11/22 1240     Visit Number 15    Number of Visits 17    Date for OT Re-Evaluation 11/13/22    Authorization Time Period Reporting period beginning 11/03/22    Progress Note Due on Visit 10    OT Start Time 1300    OT Stop Time 1400    OT Time Calculation (min) 60 min    Equipment Utilized During Treatment none    Activity Tolerance Patient tolerated treatment well    Behavior During Therapy WFL for tasks assessed/performed             Past Medical History:  Diagnosis Date   Arthritis    Bilateral knees   GERD (gastroesophageal reflux disease)    Hyperlipidemia    Past Surgical History:  Procedure Laterality Date   CATARACT EXTRACTION W/PHACO Left 03/30/2018   Procedure: CATARACT EXTRACTION PHACO AND INTRAOCULAR LENS PLACEMENT ([place])  LEFT;  Surgeon: Leandrew Koyanagi, MD;  Location: [place];  Service: Ophthalmology;  Laterality: Left;   CATARACT EXTRACTION W/PHACO Right 01/29/2021   Procedure: CATARACT EXTRACTION PHACO AND INTRAOCULAR LENS PLACEMENT ([place]) RIGHT;  Surgeon: Leandrew Koyanagi, MD;  Location: [place];  Service: Ophthalmology;  Laterality: Right;  3.24 1:03.0 5.18%   COLONOSCOPY  2011   Dr. Bobbye Charleston   COLONOSCOPY N/A 05/01/2015   Procedure: COLONOSCOPY;  Surgeon: Robert Bellow, MD;  Location: [facility];  Service: Endoscopy;  Laterality: N/A;   GANGLION CYST EXCISION Left 08/2006   Patient Active Problem List   Diagnosis Date Noted   CN (constipation) 03/19/2015   ONSET DATE: 2 years  REFERRING DIAG: Parkinson's Disease  THERAPY DIAG:  Muscle weakness (generalized)  Other lack of coordination  Other abnormalities of gait and mobility  Rationale for  Evaluation and Treatment: Rehabilitation  SUBJECTIVE:  SUBJECTIVE STATEMENT: Pt reports that he tried his exercises again this morning.  PERTINENT HISTORY: Per note from Dr. Manuella Ghazi on 09/22/22 Mr. 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  Parkinson's Disease in a patient with unilateral left hand resting tremor, loss of sense of smell, imbalance, dream enactment, constipation, shuffling gait, short steps, decreased arm swing (R > L), slowness of the movements, feeling of stiffness: Patient reports having a left sided tremor and imbalance since about 2021. He reports it is hard for him to stand for a long time. Patient reports loss of sense of smell in about 2005 and constipation since about 2015 (Miralax and dulcolax). Patient also reports pulling sensation to the left. Patient reports he has always been soft-spoken. Patient reports no family history of Parkinson's disease.  At initial evaluation Mr. Jesus denied decreased volume of speech, drooling, difficulty swallowing, decreased facial expressions, small hand writing, hunched forward posture, transient visual hallucination, depression, anxiety, urinary urgency, sexual dysfunction, drop in blood pressure with change in posture etc.   PRECAUTIONS: Fall  WEIGHT BEARING RESTRICTIONS: No  PAIN:  Are you having pain? 0/10, L ankle; intermittent arthritic pain.  Rest effective to reduce soreness.  FALLS: Has patient fallen in last 6 months? No  LIVING ENVIRONMENT: Lives with: lives with their family, spouse and daughter Lives in: 2 level home Stairs: Yes: Internal: 1  flight steps; on left going up and External: 2 steps; on left going up Has following equipment at home: None  PLOF: Independent  PATIENT GOALS: Improve balance  OBJECTIVE:   HAND DOMINANCE: Left  ADLs: Overall ADLs: indep Transfers/ambulation related to ADLs: indep Eating: able to cut own food Grooming:  indep UB Dressing: indep LB Dressing: indep Toileting: modified indep with elevated toilet seat Bathing: indep Tub Shower transfers: indep with walk in shower Equipment: none  IADLs: Shopping: spouse manages  Light housekeeping: pt participates in light housekeeping, sweeping, vacuuming Meal Prep: spouse manages at main meals at baseline; pt can make his tea or coffee  Community mobility: fatigues with community mobility  Medication management: pt/spouse report pt is indep with medications  Financial management: pt and spouse manage together  Handwriting: 100% legible and Increased time  MOBILITY STATUS:  Shuffling of gait; pt endorses some occasional freezing, decreased arm swing bilaterally but worse on the R  POSTURE COMMENTS:  rounded shoulders Sitting balance: Moves/returns truncal midpoint >2 inches in all planes  ACTIVITY TOLERANCE: Activity tolerance: Pt reports fatigue with community mobility.   FUNCTIONAL OUTCOME MEASURES: FOTO: 51; predicted 62 , 10th visit score:  65  UPPER EXTREMITY ROM:  BUEs WFL  UPPER EXTREMITY MMT:   BUEs 5/5  HAND FUNCTION: Grip strength: Right: 53 lbs; Left: 40 lbs, Lateral pinch: Right: 20 lbs, Left: 21 lbs, and 3 point pinch: Right: 14 lbs, Left: 14 lbs  COORDINATION: 9 Hole Peg test: Right: 42 sec; Left: 37 sec  5x STS: 15 sec (>16 sec is fall risk) BERG: 49/56 (low fall risk); difficulty with tandem stance (unable to hold heel to toe), forward reach (9"), single leg stance (R leg 3 sec), and turning to the R 360* (5 sec)  6 MWT: Will plan to complete next session  SENSATION: To be further assessed  MUSCLE TONE: Pt endorses some general stiffness; no rigidity noted   COGNITION: Overall cognitive status: decreased short term memory, increased time for processing information (increased time for 1 and 2 step commands)  VISION: Subjective report: wears glasses all the time, bifocals, hx of cataracts removed both sides    PERCEPTION: WFL  PRAXIS: Impaired: Motor planning; FOGQ: 5/24 (minimal freezing of gait)  OBSERVATIONS: Noted shuffling gait, resting LUE tremor, decreased arm swing bilaterally but R>L  TODAY'S TREATMENT:                                                                                                                              LSVT: Self Care: Handwriting: Pt initiated his own short sentence.  Pt able to initiate good word spacing with 4 of 5 repetitions of same sentence.  Min verbal and vc for maintaining sufficient word spacing to prevent crowding after 1st rep.  Neuro re-ed: Patient seen for LSVT Daily Session Maximal Daily Exercises for facilitation/coordination of movement Maximum Sustained Movements are designed to rescale the amplitude of movement output for generalization to  daily functional activities. Performed as follows for 1 set of 10 repetitions each multi-directional sustained movements: 1) Floor to ceiling  2) Side to side multidirectional   Repetitive movements performed in standing and are designed to provide retraining effort needed for sustained muscle activation in tasks. Performed as follows for 1 set of 10 repetitions each of multi-directional repetitive movements: 3) Step and reach forward step 4) Step and reach sideways step 5) Step and reach backwards step 6) Rock and reach forward/backward  7) Rock and reach sideways   Functional Component Task: Sit to stand BIG functional component task with supervision 10 reps Threading zipper: 5 reps, extra time, set up assist for spatial orientation; pt completes in standing  3. Picking an object up from floor: pt picks up 5 cones from floor; indep 4. Transferring from low chair 5. TBD  Hierarchy task: Handwriting: see above. 2. Stair negotiation: pt negotiated 1 step leading with the L foot, 5 reps without handrail.   BIG ambulation:  141ft x3 with mod verbal and visual cues for increasing arm swing,  especially on the R arm posteriorly.  Intermittent cues for bigger foot clearance.  Practiced bilat arm swing with pt given target to touch the wall behind him on the posterior arm swing, and OT hands on the anterior arm swing to achieve Big arm swing.  Pt completed 3 sets 10 reps each with min vc and visual cues to complete.  Cone drills:  Completed standing marches tapping top of cone with each march x1 set 10 reps.  Multiple trials needed to make it through 1 full set without knocking down a cone.  Multiple trials but pt unable to complete a 2nd set, despite cues for increasing foot clearance and lateral WS.  Transitioned to seated marches with OT providing target (knees to hands) for each march to increase foot clearance.  Pt completed 3 sets 10 reps.    SLS: Min tactile cues for WS to the L when standing on L leg and use of ledge for support.  Pt required single handed UE support but was able to progress to 1 finger and then no UE support after repetition on each leg.  Encouraged pt use chair or countertop for support at home.   PATIENT EDUCATION: Education details: maximal daily exercises, daily cognitive activities  Person educated: Patient and Spouse Education method: Explanation, vc, tactile cues, demo, written handout Education comprehension: verbalized understanding, further training needed  HOME EXERCISE PROGRAM: Maximal daily exercises 2x daily   GOALS: Goals reviewed with patient? yes  SHORT TERM GOALS: Target date: 10/30/22 (2 weeks)  Pt will perform maximal daily exercises with written handouts and mod vc from spouse. Baseline: Will initiate next session, 10TH VISIT:  continues to require assist with select exercises for balance Goal status: ONGOING  2.  Pt will complete 360 turn test R/L in 4 sec or less to decrease fall risk with directional changes. Baseline: R 5 sec, L 4 sec, 10th visit:  4 sec right, 4 sec left  Goal status: MET    LONG TERM GOALS: Target date:  11/13/22 (4 weeks)  Pt will increase FOTO score by 5 or more points to indicate clinically relevant improvement in daily tasks. Baseline: Eval: 51; predicted 62, 10TH visit: 65 Goal status: MET  2.  Pt will demo increased tolerance for community mobility as noted by increase in 6 MWT by 100 ft. Baseline: Eval NT d/t time constraints: To be performed next session; 10/20/22: 1,302 ft,  10th visit: 1355 feet  Goal status: ONGOING   3.  Pt will demo increased balance with ADLs and mobility as noted by increase in BERG balance score by 3 points) Baseline: Eval: 49/56, 10th visit:  Goal status: ONGOING  4.  Pt will easily reach to floor to pick up an ADL item with indep Baseline: Eval: supv to pick up pen from floor (increased time, effort, wide BOS), 10th visit: continues to require increased time, effort and larger BOS Goal status: ONGOING  5.  Pt will be modified indep to negotiate flight of steps with use of handrail. Baseline: Pt avoids 2nd floor of his home, 10th visit: remains uncomfortable with stairs Goal status: ONGOING    ASSESSMENT:  CLINICAL IMPRESSION: Pt reports that he tried his MDE again this morning.  Pt continues to require visual demonstration to sequence through maximal daily exercises, and max A to maintain rep count with each d/t difficulty with dual tasking.  Pt required min-mod tactile cues today for coordination of arm swing with rocking and transitions between R/L when performing forward rock and reach and side ways rock and reach.  Pt struggled with cone drills today, completing 1 full set of 10 reps without knocking down a cone during standing marches, but with multiple attempts, pt was unable to complete a 2nd or 3rd set as compared to previous days d/t decreased foot clearance and lateral WS causing cones to fall.  Transitioned to seated marches and SLS activities to focus on these limitations.  Pt was able to stand on L leg up to 7 sec without UE support, and R leg up  to 10 sec today after frequent trials.  Avoided full flight of stairs today as pt stated that L knee was irritated last time pt practiced a flight.  Pt will continue to benefit from skilled OT with participation in LSVT BIG program to increase amplitude of movements and kinesthetic awareness in order to maximize balance, endurance, and strength for ADLs and mobility.   PERFORMANCE DEFICITS: in functional skills including IADLs, coordination, dexterity, proprioception, strength, flexibility, Fine motor control, Gross motor control, mobility, balance, body mechanics, endurance, decreased knowledge of use of DME, and UE functional use, cognitive skills including memory, safety awareness, and understand, and psychosocial skills including habits and routines and behaviors.   IMPAIRMENTS: are limiting patient from IADLs, rest and sleep, and leisure.   CO-MORBIDITIES: may have co-morbidities  that affects occupational performance. Patient will benefit from skilled OT to address above impairments and improve overall function.  MODIFICATION OR ASSISTANCE TO COMPLETE EVALUATION: No modification of tasks or assist necessary to complete an evaluation.  OT OCCUPATIONAL PROFILE AND HISTORY: Problem focused assessment: Including review of records relating to presenting problem.  CLINICAL DECISION MAKING: Moderate - several treatment options, min-mod task modification necessary  REHAB POTENTIAL: Good  EVALUATION COMPLEXITY: Moderate    PLAN:  OT FREQUENCY: eval on 10/15/21, will begin 4x/week on 10/20/22  OT DURATION: 4 weeks  PLANNED INTERVENTIONS: self care/ADL training, therapeutic exercise, therapeutic activity, neuromuscular re-education, manual therapy, gait training, balance training, stair training, functional mobility training, patient/family education, cognitive remediation/compensation, energy conservation, coping strategies training, and DME and/or AE instructions  RECOMMENDED OTHER SERVICES:  N/A  CONSULTED AND AGREED WITH PLAN OF CARE: Patient and family member/caregiver  PLAN FOR NEXT SESSION: Maximal Daily Exercises, continued focus on functional balance  Danelle Earthly, MS, OTR/L   Otis Dials, OT 11/11/2022, 12:41 PM

## 2022-11-12 ENCOUNTER — Ambulatory Visit: Payer: Medicare Other | Attending: Neurology

## 2022-11-12 DIAGNOSIS — R278 Other lack of coordination: Secondary | ICD-10-CM | POA: Insufficient documentation

## 2022-11-12 DIAGNOSIS — M6281 Muscle weakness (generalized): Secondary | ICD-10-CM | POA: Diagnosis present

## 2022-11-12 DIAGNOSIS — R2689 Other abnormalities of gait and mobility: Secondary | ICD-10-CM

## 2022-11-15 NOTE — Therapy (Signed)
OUTPATIENT OCCUPATIONAL THERAPY LSVT BIG NEURO DISCHARGE Patient Name: Carlos Morrison MRN: [id number] DOB:28-Nov-1940, 82 y.o., male Today's Date: 11/02/2022  PCP: Dr. Tracie Harrier REFERRING PROVIDER: Dr. Jennings Books  END OF SESSION:  OT End of Session - 11/15/22 1445     Visit Number 17    Number of Visits 17    Date for OT Re-Evaluation 11/13/22    Authorization Time Period Reporting period beginning 11/03/22    Progress Note Due on Visit 10    OT Start Time 1300    OT Stop Time 1400    OT Time Calculation (min) 60 min    Equipment Utilized During Treatment none    Activity Tolerance Patient tolerated treatment well    Behavior During Therapy WFL for tasks assessed/performed             Past Medical History:  Diagnosis Date   Arthritis    Bilateral knees   GERD (gastroesophageal reflux disease)    Hyperlipidemia    Past Surgical History:  Procedure Laterality Date   CATARACT EXTRACTION W/PHACO Left 03/30/2018   Procedure: CATARACT EXTRACTION PHACO AND INTRAOCULAR LENS PLACEMENT ([place])  LEFT;  Surgeon: Leandrew Koyanagi, MD;  Location: [place];  Service: Ophthalmology;  Laterality: Left;   CATARACT EXTRACTION W/PHACO Right 01/29/2021   Procedure: CATARACT EXTRACTION PHACO AND INTRAOCULAR LENS PLACEMENT ([place]) RIGHT;  Surgeon: Leandrew Koyanagi, MD;  Location: [place];  Service: Ophthalmology;  Laterality: Right;  3.24 1:03.0 5.18%   COLONOSCOPY  2011   Dr. Bobbye Charleston   COLONOSCOPY N/A 05/01/2015   Procedure: COLONOSCOPY;  Surgeon: Robert Bellow, MD;  Location: [facility] END[facility]COPY;  Service: Endoscopy;  Laterality: N/A;   GANGLION CYST EXCISION Left 08/2006   Patient Active Problem List   Diagnosis Date Noted   CN (constipation) 03/19/2015   ONSET DATE: 2 years  REFERRING DIAG: Parkinson's Disease  THERAPY DIAG:  Muscle weakness (generalized)  Other lack of coordination  Other abnormalities of gait and mobility  Rationale for  Evaluation and Treatment: Rehabilitation  SUBJECTIVE:  SUBJECTIVE STATEMENT: Pt reports doing well today.  PERTINENT HISTORY: Per note from Dr. 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  Parkinson's Disease in a patient with unilateral left hand resting tremor, loss of sense of smell, imbalance, dream enactment, constipation, shuffling gait, short steps, decreased arm swing (R > L), slowness of the movements, feeling of stiffness: Patient reports having a left sided tremor and imbalance since about 2021. He reports it is hard for him to stand for a long time. Patient reports loss of sense of smell in about 2005 and constipation since about 2015 (Miralax and dulcolax). Patient also reports pulling sensation to the left. Patient reports he has always been soft-spoken. Patient reports no family history of Parkinson's disease.  At initial evaluation Carlos Morrison denied decreased volume of speech, drooling, difficulty swallowing, decreased facial expressions, small hand writing, hunched forward posture, transient visual hallucination, depression, anxiety, urinary urgency, sexual dysfunction, drop in blood pressure with change in posture etc.   PRECAUTIONS: Fall  WEIGHT BEARING RESTRICTIONS: No  PAIN:  Are you having pain? 0/10, L ankle; intermittent arthritic pain.  Rest effective to reduce soreness.  FALLS: Has patient fallen in last 6 months? No  LIVING ENVIRONMENT: Lives with: lives with their family, spouse and daughter Lives in: 2 level home Stairs: Yes: Internal: 1 flight steps; on left going  up and External: 2 steps; on left going up Has following equipment at home: None  PLOF: Independent  PATIENT GOALS: Improve balance  OBJECTIVE:   HAND DOMINANCE: Left  ADLs: Overall ADLs: indep Transfers/ambulation related to ADLs: indep Eating: able to cut own food Grooming: indep UB Dressing: indep LB  Dressing: indep Toileting: modified indep with elevated toilet seat Bathing: indep Tub Shower transfers: indep with walk in shower Equipment: none  IADLs: Shopping: spouse manages  Light housekeeping: pt participates in light housekeeping, sweeping, vacuuming Meal Prep: spouse manages at main meals at baseline; pt can make his tea or coffee  Community mobility: fatigues with community mobility  Medication management: pt/spouse report pt is indep with medications  Financial management: pt and spouse manage together  Handwriting: 100% legible and Increased time  MOBILITY STATUS:  Shuffling of gait; pt endorses some occasional freezing, decreased arm swing bilaterally but worse on the R  POSTURE COMMENTS:  rounded shoulders Sitting balance: Moves/returns truncal midpoint >2 inches in all planes  ACTIVITY TOLERANCE: Activity tolerance: Pt reports fatigue with community mobility.   FUNCTIONAL OUTCOME MEASURES: FOTO: 51; predicted 62 , 10th visit score:  65  UPPER EXTREMITY ROM:  BUEs WFL  UPPER EXTREMITY MMT:   BUEs 5/5  HAND FUNCTION: Grip strength: Right: 53 lbs; Left: 40 lbs, Lateral pinch: Right: 20 lbs, Left: 21 lbs, and 3 point pinch: Right: 14 lbs, Left: 14 lbs  COORDINATION: 9 Hole Peg test: Right: 42 sec; Left: 37 sec  5x STS: 15 sec (>16 sec is fall risk) BERG: 49/56 (low fall risk); difficulty with tandem stance (unable to hold heel to toe), forward reach (9"), single leg stance (R leg 3 sec), and turning to the R 360* (5 sec)  6 MWT: Will plan to complete next session  SENSATION: To be further assessed  MUSCLE TONE: Pt endorses some general stiffness; no rigidity noted   COGNITION: Overall cognitive status: decreased short term memory, increased time for processing information (increased time for 1 and 2 step commands)  VISION: Subjective report: wears glasses all the time, bifocals, hx of cataracts removed both sides   PERCEPTION: WFL  PRAXIS: Impaired:  Motor planning; FOGQ: 5/24 (minimal freezing of gait)  OBSERVATIONS: Noted shuffling gait, resting LUE tremor, decreased arm swing bilaterally but R>L  TODAY'S TREATMENT:                                                                                                                              LSVT: Therapeutic Exercise: Objective measures taken and goals updated for discharge. 6 MWT: Eval: 1,302 ft; d/c: 1,536ft 360 turn test: Eval: R 5 sec, L 4 sec; d/c: R 4 sec, L 3 sec 5x STS: Eval: 15 sec; d/c:12 sec Berg: Eval: 49/56; d/c: 54/56 SLS: Eval: R 3 sec, L unable; d/c: R 9 sec, L 4 sec ABC scale: Eval: 82%; d/c: 86.25%  FOTO: Eval: 51; d/c: 79  Neuro re-ed: Patient seen  for LSVT Daily Session Maximal Daily Exercises for facilitation/coordination of movement Maximum Sustained Movements are designed to rescale the amplitude of movement output for generalization to daily functional activities. Performed as follows for 1 set of 10 repetitions each multi-directional sustained movements: 1) Floor to ceiling  2) Side to side multidirectional   Repetitive movements performed in standing and are designed to provide retraining effort needed for sustained muscle activation in tasks. Performed as follows for 1 set of 10 repetitions each of multi-directional repetitive movements: 3) Step and reach forward step 4) Step and reach sideways step 5) Step and reach backwards step 6) Rock and reach forward/backward  7) Rock and reach sideways   Functional Component Task: Sit to stand BIG functional component task with supervision 10 reps Threading zipper: 5 reps, extra time; pt completes in standing  3. Picking an object up from floor: pt picks up 5 cones from floor; indep 4. Transferring from low chair 5. TBD  Hierarchy task: Handwriting Stair negotiation: able to complete 1 flight with modified indep; hand rail.    BIG ambulation:  139ft x3 with mod verbal and visual cues for increasing arm  swing, especially on the R arm posteriorly.  Intermittent cues for bigger foot clearance.  PATIENT EDUCATION: Education details: maximal daily exercises Person educated: Patient and Spouse Education method: Explanation, vc, tactile cues, demo, written handout Education comprehension: verbalized understanding HOME EXERCISE PROGRAM: Maximal daily exercises 2x daily   GOALS: Goals reviewed with patient? yes  SHORT TERM GOALS: Target date: 10/30/22 (2 weeks)  Pt will perform maximal daily exercises with written handouts and mod vc from spouse. Baseline: Will initiate next session, 10TH VISIT:  continues to require assist with select exercises for balance; 11/12/22: able to perform with mod vc/tactile cues Goal status: MET  2.  Pt will complete 360 turn test R/L in 4 sec or less to decrease fall risk with directional changes. Baseline: R 5 sec, L 4 sec, 10th visit:  4 sec right, 4 sec left; 11/12/22: R 4 sec, L 3 sec Goal status: MET    LONG TERM GOALS: Target date: 11/13/22 (4 weeks)  Pt will increase FOTO score by 5 or more points to indicate clinically relevant improvement in daily tasks. Baseline: Eval: 51; predicted 62, 10TH visit: 65; 11/12/22: 79 Goal status: MET  2.  Pt will demo increased tolerance for community mobility as noted by increase in 6 MWT by 100 ft. Baseline: Eval NT d/t time constraints: To be performed next session; 10/20/22: 1,302 ft, 10th visit: 1355 feet; 11/12/22: 1,535 ft Goal status: MET   3.  Pt will demo increased balance with ADLs and mobility as noted by increase in BERG balance score by 3 points. Baseline: Eval: 49/56, 11/12/22: 54/56 Goal status: MET  4.  Pt will easily reach to floor to pick up an ADL item with indep Baseline: Eval: supv to pick up pen from floor (increased time, effort, wide BOS), 10th visit: continues to require increased time, effort and larger BOS; 11/12/22: indep Goal status: MET  5.  Pt will be modified indep to negotiate flight of steps  with use of handrail. Baseline: Pt avoids 2nd floor of his home, 10th visit: remains uncomfortable with stairs; 11/12/22: pt can negotiate up/down flight of stairs with 1 hand rail.  (Pt still avoids d/t pain in L knee, but able with extra time) Goal status: MET    ASSESSMENT:  CLINICAL IMPRESSION: Pt seen this date for d/c assessment after 4 weeks  of LSVT BIG program completion.  Pt showed excellent gains with all outcome measures as follows:   6 MWT: Eval: 1,302 ft; d/c: 1,552ft 360 turn test: Eval: R 5 sec, L 4 sec; d/c: R 4 sec, L 3 sec 5x STS: Eval: 15 sec; d/c:12 sec Berg: Eval: 49/56; d/c: 54/56 SLS: Eval: R 3 sec, L unable; d/c: R 9 sec, L 4 sec ABC scale: Eval: 82%; d/c: 86.25%  FOTO: Eval: 51; d/c: 79   Pt has met all STG and LTG in poc and has improved with all functional component and hierarchy tasks.  Pt demos some improvement with arm swing and foot clearance with ambulation, but requires verbal and visual cues to maintain when fatigued.  Pt continues to require tactile and visual cues for coordinating correct arm and leg movements with maximal daily exercises, but spouse feels comfortable in assisting pt using handout and they will work to carry over these exercises at home.  OT educated that the program recommendation is to continue exercises 1x daily.  OT encouraged daily activity to maintain mobility and balance.  Pt/spouse acknowledged recommendations.  Pt appropriate for OT d/c this date.  PERFORMANCE DEFICITS: in functional skills including IADLs, coordination, dexterity, proprioception, strength, flexibility, Fine motor control, Gross motor control, mobility, balance, body mechanics, endurance, decreased knowledge of use of DME, and UE functional use, cognitive skills including memory, safety awareness, and understand, and psychosocial skills including habits and routines and behaviors.   IMPAIRMENTS: are limiting patient from IADLs, rest and sleep, and leisure.    CO-MORBIDITIES: may have co-morbidities  that affects occupational performance. Patient will benefit from skilled OT to address above impairments and improve overall function.  MODIFICATION OR ASSISTANCE TO COMPLETE EVALUATION: No modification of tasks or assist necessary to complete an evaluation.  OT OCCUPATIONAL PROFILE AND HISTORY: Problem focused assessment: Including review of records relating to presenting problem.  CLINICAL DECISION MAKING: Moderate - several treatment options, min-mod task modification necessary  REHAB POTENTIAL: Good  EVALUATION COMPLEXITY: Moderate    PLAN:  OT FREQUENCY: eval on 10/15/21, will begin 4x/week on 10/20/22  OT DURATION: 4 weeks  PLANNED INTERVENTIONS: self care/ADL training, therapeutic exercise, therapeutic activity, neuromuscular re-education, manual therapy, gait training, balance training, stair training, functional mobility training, patient/family education, cognitive remediation/compensation, energy conservation, coping strategies training, and DME and/or AE instructions  RECOMMENDED OTHER SERVICES: N/A  CONSULTED AND AGREED WITH PLAN OF CARE: Patient and family member/caregiver  PLAN FOR NEXT SESSION: N/A; d/c assessment completed this date.  Danelle Earthly, MS, OTR/L  [place] Dials, OT 11/15/2022, 2:47 PM

## 2023-04-14 ENCOUNTER — Ambulatory Visit: Payer: Medicare Other | Attending: Internal Medicine | Admitting: Physical Therapy

## 2023-04-14 DIAGNOSIS — M6281 Muscle weakness (generalized): Secondary | ICD-10-CM | POA: Insufficient documentation

## 2023-04-14 DIAGNOSIS — R278 Other lack of coordination: Secondary | ICD-10-CM | POA: Insufficient documentation

## 2023-04-14 DIAGNOSIS — R2689 Other abnormalities of gait and mobility: Secondary | ICD-10-CM | POA: Diagnosis present

## 2023-04-14 NOTE — Therapy (Signed)
OUTPATIENT PHYSICAL THERAPY NEURO EVALUATION   Patient Name: Carlos Morrison MRN: [id number] DOB:Jun 27, 1941, 82 y.o., male Today's Date: 04/14/2023   PCP: Barbette Reichmann, MD  REFERRING PROVIDER: Barbette Reichmann, MD   END OF SESSION:  PT End of Session - 04/14/23 1515     Visit Number 1    Number of Visits 24    Date for PT Re-Evaluation 07/07/23    PT Start Time 1515    PT Stop Time 1604    PT Time Calculation (min) 49 min    Equipment Utilized During Treatment Gait belt    Activity Tolerance Patient tolerated treatment well;No increased pain    Behavior During Therapy WFL for tasks assessed/performed             Past Medical History:  Diagnosis Date   Arthritis    Bilateral knees   GERD (gastroesophageal reflux disease)    Hyperlipidemia    Past Surgical History:  Procedure Laterality Date   CATARACT EXTRACTION W/PHACO Left 03/30/2018   Procedure: CATARACT EXTRACTION PHACO AND INTRAOCULAR LENS PLACEMENT ([facility])  LEFT;  Surgeon: Lockie Mola, MD;  Location[facility];  Service: Ophthalmology;  Laterality: Left;   CATARACT EXTRACTION W/PHACO Right 01/29/2021   Procedure: CATARACT EXTRACTION PHACO AND INTRAOCULAR LENS PLACEMENT ([facility]) RIGHT;  Surgeon: Lockie Mola, MD;  Location: [facility];  Service: Ophthalmology;  Laterality: Right;  3.24 1:03.0 5.18%   COLONOSCOPY  2011   Dr. Kizzie Ide   COLONOSCOPY N/A 05/01/2015   Procedure: COLONOSCOPY;  Surgeon: Earline Mayotte, MD;  Location: [facility] END[facility]COPY;  Service: Endoscopy;  Laterality: N/A;   GANGLION CYST EXCISION Left 08/2006   Patient Active Problem List   Diagnosis Date Noted   CN (constipation) 03/19/2015    ONSET DATE: fall 2 weeks ago.    REFERRING DIAG:  R26.89 (ICD-10-CM) - Other abnormalities of gait and mobility  Z91.81 (ICD-10-CM) - History of falling    THERAPY DIAG:  Muscle weakness (generalized)  Other lack of coordination  Other abnormalities of gait and  mobility  Rationale for Evaluation and Treatment: Rehabilitation  SUBJECTIVE:                                                                                                                                                                                             SUBJECTIVE STATEMENT: Pt has fell 2 weeks prior with no injury. 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 Pt received 4 weeks of LSVT in January of this Year( 2024).   Pt accompanied by: significant other [name] Freer   PERTINENT HISTORY: recent fall.   PAIN:  Are you having  pain? No  PRECAUTIONS: None  WEIGHT BEARING RESTRICTIONS: No  FALLS: Has patient fallen in last 6 months? Yes. Number of falls 1  LIVING ENVIRONMENT: Lives with: lives with their spouse Lives in: House/apartment Stairs: Yes: External: 4 steps; on left going up Has following equipment at home: None  PLOF: Independent and Independent with basic ADLs  PATIENT GOALS: improve balance. Reduce fall risk. Per Wife: improve Arm stretch    OBJECTIVE:   DIAGNOSTIC FINDINGS: EXAM: MRI HEAD WITHOUT CONTRAST  IMPRESSION: No evidence of acute intracranial abnormality.   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   Mild generalized cerebral and cerebellar atrophy.   Mild paranasal sinus disease, as described.   Trace fluid within the bilateral mastoid air cells.    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   EDEMA:  WFL  MUSCLE TONE: WFL   MUSCLE LENGTH: WFL  DTRs:  Patella 2+ = Normal  POSTURE: rounded shoulders, forward head, and flexed trunk   LOWER EXTREMITY ROM:     Active  Right Eval Left Eval  Hip flexion [facility]  Hip extension    Hip abduction [facility]  Hip adduction [facility]  Knee flexion [facility] WFL  Knee extension WFL  Lacking 5 deg full extnesion   Ankle dorsiflexion WFL WFL   (Blank rows = not tested)  LOWER EXTREMITY MMT:    MMT Right Eval Left Eval  Hip flexion 4+ 4+  Hip extension    Hip abduction 4+ 4+  Hip adduction 5 5  Knee flexion 4+ 4+  Knee extension 5 5  Ankle dorsiflexion 4+ 4+  (Blank rows = not tested)  BED MOBILITY:  Sit to supine Complete Independence Supine to sit Complete Independence  TRANSFERS: Assistive device utilized: None  Sit to stand: Complete Independence Stand to sit: Complete Independence Chair to chair: Complete Independence Floor:  to be assessed   RAMP:  Level of Assistance: CGA Assistive device utilized:  none   CURB:  Level of Assistance: CGA Assistive device utilized: None   STAIRS: Level of Assistance: SBA Stair Negotiation Technique: Step to Pattern with Bilateral Rails Number of Stairs: 4  Height of Stairs: 6  Comments:  1 rail with step to ascent, 2 rails with step through descent   GAIT: Gait pattern: step through pattern, decreased arm swing- Right, decreased arm swing- Left, Left foot flat, shuffling, and narrow BOS Distance walked: 1257 in 6 min walk  Assistive device utilized: None Level of assistance: Complete Independence Comments: increased shuffle/festination as well as tremor in LUE/LLE with fatigue   FUNCTIONAL TESTS:  5 times sit to stand: 16.47sec  Timed up and go (TUG): 13.37sec  6 minute walk test: 1257' with no AD 10 meter walk test: 10.67sec(0.67m/s)  Dynamic Gait Index: TBD  PATIENT SURVEYS:  FOTO 55  TODAY'S TREATMENT:  DATE: 04/14/2023    Evaluation only    PATIENT EDUCATION: Education details: POC, Therapy orientation, rehab goals and expectations Person educated: Patient and Spouse Education method: Medical illustrator Education comprehension: verbalized  understanding  HOME EXERCISE PROGRAM: To be given at next session    SHORT TERM GOALS: Target date: 05/19/2023    Patient will be independent in home exercise program to improve strength/mobility for better functional independence with ADLs. Baseline: to be given at next session  Goal status: INITIAL   LONG TERM GOALS: Target date: 07/07/2023    Patient will increase FOTO score to equal to or greater than  59  to demonstrate statistically significant improvement in mobility and quality of life.  Baseline: 55 Goal status: INITIAL  2.  Patient (> 46 years old) will complete five times sit to stand test in < 15 seconds indicating an increased LE strength and improved balance. Baseline: 16.47 sec  Goal status: INITIAL  3.  Patient will increase Berg Balance score by > 6 points to demonstrate decreased fall risk during functional activities Baseline: to be  completed at next session  Goal status: INITIAL  4.  Patient will increase 10 meter walk test to >1.39m/s as to improve gait speed for better community ambulation and to reduce fall risk. Baseline: 0.28m/s Goal status: INITIAL  5.  Patient will reduce timed up and go to <11 seconds to reduce fall risk and demonstrate improved transfer/gait ability. Baseline: 13.37 Goal status: INITIAL  6.  Patient will increase dynamic gait index score to >19/24 as to demonstrate reduced fall risk and improved dynamic gait balance for better safety with community/home ambulation.   Baseline: to be done at next session  Goal status: INITIAL  ASSESSMENT:  CLINICAL IMPRESSION: Patient is a 82 y.o. male  who was seen today for physical therapy evaluation and treatment for fall and PD with balance deficits. Pt has received treatment for PD in January of 2024, but made limited progress due to PD related cognitive deficits. Pt demonstrates increased fall risk with decreased distance on 6 min walk test, 5xSTS, gait speed and TUG. Pt will benefit from  skilled PT to improve balance, reduce fall risk, improve community access   OBJECTIVE IMPAIRMENTS: Abnormal gait, cardiopulmonary status limiting activity, decreased activity tolerance, decreased balance, decreased cognition, decreased coordination, decreased endurance, decreased knowledge of condition, decreased mobility, difficulty walking, decreased ROM, decreased strength, decreased safety awareness, impaired flexibility, impaired UE functional use, and improper body mechanics.   ACTIVITY LIMITATIONS: carrying, standing, and locomotion level  PARTICIPATION LIMITATIONS: meal prep, cleaning, laundry, driving, community activity, yard work, and school  PERSONAL FACTORS: Age, Behavior pattern, Education, Fitness, and Time since onset of injury/illness/exacerbation are also affecting patient's functional outcome.   REHAB POTENTIAL: Good  CLINICAL DECISION MAKING: Stable/uncomplicated  EVALUATION COMPLEXITY: Moderate  PLAN:  PT FREQUENCY: 1-2x/week  PT DURATION: 12 weeks  PLANNED INTERVENTIONS: Therapeutic exercises, Therapeutic activity, Neuromuscular re-education, Balance training, Gait training, Patient/Family education, Self Care, Joint mobilization, Stair training, DME instructions, Cognitive remediation, Cryotherapy, Moist heat, Manual therapy, and Re-evaluation  PLAN FOR NEXT SESSION:   DGI. BERG.  Balance HEP.   Grier Rocher PT, DPT  Physical Therapist - [place]  [facility]  5:11 PM 04/14/23

## 2023-04-20 ENCOUNTER — Encounter: Payer: Self-pay | Admitting: Physical Therapy

## 2023-04-20 ENCOUNTER — Ambulatory Visit: Payer: Medicare Other | Admitting: Physical Therapy

## 2023-04-20 DIAGNOSIS — M6281 Muscle weakness (generalized): Secondary | ICD-10-CM | POA: Diagnosis not present

## 2023-04-20 DIAGNOSIS — R2689 Other abnormalities of gait and mobility: Secondary | ICD-10-CM

## 2023-04-20 DIAGNOSIS — R278 Other lack of coordination: Secondary | ICD-10-CM

## 2023-04-20 NOTE — Therapy (Signed)
OUTPATIENT PHYSICAL THERAPY NEURO TREATMENT   Patient Name: Carlos Morrison MRN: [id number] DOB:07/26/1941, 82 y.o., male Today's Date: 04/20/2023   PCP: Barbette Reichmann, MD  REFERRING PROVIDER: Barbette Reichmann, MD   END OF SESSION:  PT End of Session - 04/20/23 1058     Visit Number 2    Number of Visits 24    Date for PT Re-Evaluation 07/07/23    PT Start Time 1100    PT Stop Time 1142    PT Time Calculation (min) 42 min    Equipment Utilized During Treatment Gait belt    Activity Tolerance Patient tolerated treatment well;No increased pain    Behavior During Therapy WFL for tasks assessed/performed             Past Medical History:  Diagnosis Date   Arthritis    Bilateral knees   GERD (gastroesophageal reflux disease)    Hyperlipidemia    Past Surgical History:  Procedure Laterality Date   CATARACT EXTRACTION W/PHACO Left 03/30/2018   Procedure: CATARACT EXTRACTION PHACO AND INTRAOCULAR LENS PLACEMENT (IOC)  LEFT;  Surgeon: Lockie Mola, MD;  Location: [facility];  Service: Ophthalmology;  Laterality: Left;   CATARACT EXTRACTION W/PHACO Right 01/29/2021   Procedure: CATARACT EXTRACTION PHACO AND INTRAOCULAR LENS PLACEMENT (IOC) RIGHT;  Surgeon: Lockie Mola, MD;  Location: [facility];  Service: Ophthalmology;  Laterality: Right;  3.24 1:03.0 5.18%   COLONOSCOPY  2011   Dr. Kizzie Ide   COLONOSCOPY N/A 05/01/2015   Procedure: COLONOSCOPY;  Surgeon: Earline Mayotte, MD;  Location: [facility] END[facility];  Service: Endoscopy;  Laterality: N/A;   GANGLION CYST EXCISION Left 08/2006   Patient Active Problem List   Diagnosis Date Noted   CN (constipation) 03/19/2015    ONSET DATE: fall 2 weeks ago.    REFERRING DIAG:  R26.89 (ICD-10-CM) - Other abnormalities of gait and mobility  Z91.81 (ICD-10-CM) - History of falling    THERAPY DIAG:  Muscle weakness (generalized)  Other lack of coordination  Other abnormalities of gait and  mobility  Rationale for Evaluation and Treatment: Rehabilitation  SUBJECTIVE:                                                                                                                                                                                             SUBJECTIVE STATEMENT: Pt reports that he is doing well. No falls, trips or stumbles since last PT visit. No pain on this   Pt accompanied by: significant other [name] Pavey   PERTINENT HISTORY: recent fall.   PAIN:  Are you having pain? No  PRECAUTIONS: None  WEIGHT  BEARING RESTRICTIONS: No  FALLS: Has patient fallen in last 6 months? Yes. Number of falls 1  LIVING ENVIRONMENT: Lives with: lives with their spouse Lives in: House/apartment Stairs: Yes: External: 4 steps; on left going up Has following equipment at home: None  PLOF: Independent and Independent with basic ADLs  PATIENT GOALS: improve balance. Reduce fall risk. Per Wife: improve Arm stretch    OBJECTIVE:   DIAGNOSTIC FINDINGS: EXAM: MRI HEAD WITHOUT CONTRAST  IMPRESSION: No evidence of acute intracranial abnormality.   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   Mild generalized cerebral and cerebellar atrophy.   Mild paranasal sinus disease, as described.   Trace fluid within the bilateral mastoid air cells.    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   EDEMA:  WFL  MUSCLE TONE: WFL   MUSCLE LENGTH: WFL  DTRs:  Patella 2+ = Normal  POSTURE: rounded shoulders, forward head, and flexed trunk   LOWER EXTREMITY ROM:     Active  Right Eval Left Eval  Hip flexion [facility]  Hip extension    Hip abduction [facility]  Hip adduction [facility]  Knee flexion [facility] WFL  Knee extension WFL Lacking 5 deg full extnesion   Ankle  dorsiflexion WFL WFL   (Blank rows = not tested)  LOWER EXTREMITY MMT:    MMT Right Eval Left Eval  Hip flexion 4+ 4+  Hip extension    Hip abduction 4+ 4+  Hip adduction 5 5  Knee flexion 4+ 4+  Knee extension 5 5  Ankle dorsiflexion 4+ 4+  (Blank rows = not tested)  BED MOBILITY:  Sit to supine Complete Independence Supine to sit Complete Independence  TRANSFERS: Assistive device utilized: None  Sit to stand: Complete Independence Stand to sit: Complete Independence Chair to chair: Complete Independence Floor:  to be assessed   RAMP:  Level of Assistance: CGA Assistive device utilized:  none   CURB:  Level of Assistance: CGA Assistive device utilized: None   STAIRS: Level of Assistance: SBA Stair Negotiation Technique: Step to Pattern with Bilateral Rails Number of Stairs: 4  Height of Stairs: 6  Comments:  1 rail with step to ascent, 2 rails with step through descent   GAIT: Gait pattern: step through pattern, decreased arm swing- Right, decreased arm swing- Left, Left foot flat, shuffling, and narrow BOS Distance walked: 1257 in 6 min walk  Assistive device utilized: None Level of assistance: Complete Independence Comments: increased shuffle/festination as well as tremor in LUE/LLE with fatigue   FUNCTIONAL TESTS:  5 times sit to stand: 16.47sec  Timed up and go (TUG): 13.37sec  6 minute walk test: 1257' with no AD 10 meter walk test: 10.67sec(0.68m/s)  Berg Balance Scale: 47 Dynamic Gait Index: 19 Functional gait assessment: 21  PATIENT SURVEYS:  FOTO 55  TODAY'S TREATMENT:  DATE: 04/20/2023    Patient demonstrates increased fall risk as noted by score of   47/56 on Berg Balance Scale.  (<36= high risk for falls, close to 100%; 37-45 significant >80%; 46-51 moderate >50%; 52-55 lower >25%)  PT instructed pt in DGI. See  below for results. Demonstrates increased fall risk with score of 19/24. (<19 indicates increased fall risk)   Patient demonstrates increased fall risk as noted by score of 21/30 on  Functional Gait Assessment.   <22/30 = predictive of falls, <20/30 = fall in 6 months, <18/30 = predictive of falls in PD MCID: 5 points stroke population, 4 points geriatric population (ANPTA Core Set of Outcome Measures for Adults with Neurologic Conditions, 2018)  PT provided HEP with hand out for 3 way hip with RTB x 10 BLE and  Weighted gait with 3# ankle weights x 371ft with supervision assist and cues for increased step length and height intermittently.   PATIENT EDUCATION: Education details: Pt educated throughout session about proper posture and technique with exercises. Improved exercise technique, movement at target joints, use of target muscles after min to mod verbal, visual, tactile cues. POC, Therapy orientation, rehab goals and expectations  Person educated: Patient and Spouse Education method: Explanation and Demonstration Education comprehension: verbalized understanding  HOME EXERCISE PROGRAM:  Access Code: [email or web address] URL: [email or web address] Date: 04/20/2023 Prepared by: Grier Rocher  Exercises - Standing Hip Extension with Resistance at Ankles and Counter Support  - 1 x daily - 5 x weekly - 3 sets - 10 reps - Standing Hip Abduction with Resistance at Ankles and Counter Support  - 1 x daily - 5 x weekly - 3 sets - 10 reps - Marching with Resistance  - 1 x daily - 5 x weekly - 3 sets - 10 reps   SHORT TERM GOALS: Target date: 05/19/2023    Patient will be independent in home exercise program to improve strength/mobility for better functional independence with ADLs. Baseline: to be given at next session  Goal status: INITIAL   LONG TERM GOALS: Target date: 07/07/2023    Patient will increase FOTO score to equal to or greater than  59  to demonstrate statistically  significant improvement in mobility and quality of life.  Baseline: 55 Goal status: INITIAL  2.  Patient (> 44 years old) will complete five times sit to stand test in < 15 seconds indicating an increased LE strength and improved balance. Baseline: 16.47 sec  Goal status: INITIAL  3.  Patient will increase Berg Balance score by > 6 points to demonstrate decreased fall risk during functional activities Baseline: 47/56 Goal status: INITIAL  4.  Patient will increase 10 meter walk test to >1.30m/s as to improve gait speed for better community ambulation and to reduce fall risk. Baseline: 0.79m/s Goal status: INITIAL  5.  Patient will reduce timed up and go to <11 seconds to reduce fall risk and demonstrate improved transfer/gait ability. Baseline: 13.37 Goal status: INITIAL  6.  Patient will increase dynamic gait index score to >19/24 as to demonstrate reduced fall risk and improved dynamic gait balance for better safety with community/home ambulation.   Baseline: 19/24 Goal status: INITIAL  ASSESSMENT:  CLINICAL IMPRESSION: Pt reports to PT with good motivation to all therapeutic activities. Demonstrates mild fall risk with decreased scores on Berg, FGA and DGI. PT instructed pt in HEP with hand out provided to target Bil hip weakness with emphasis on improved amplitude of movement. Pt required min-mod instruction of proper  exercise technique to address proper muscles and reduce compensation through trunk.  Pt will benefit from skilled PT to improve balance, reduce fall risk, improve community access   OBJECTIVE IMPAIRMENTS: Abnormal gait, cardiopulmonary status limiting activity, decreased activity tolerance, decreased balance, decreased cognition, decreased coordination, decreased endurance, decreased knowledge of condition, decreased mobility, difficulty walking, decreased ROM, decreased strength, decreased safety awareness, impaired flexibility, impaired UE functional use, and improper  body mechanics.   ACTIVITY LIMITATIONS: carrying, standing, and locomotion level  PARTICIPATION LIMITATIONS: meal prep, cleaning, laundry, driving, community activity, yard work, and school  PERSONAL FACTORS: Age, Behavior pattern, Education, Fitness, and Time since onset of injury/illness/exacerbation are also affecting patient's functional outcome.   REHAB POTENTIAL: Good  CLINICAL DECISION MAKING: Stable/uncomplicated  EVALUATION COMPLEXITY: Moderate  PLAN:  PT FREQUENCY: 1-2x/week  PT DURATION: 12 weeks  PLANNED INTERVENTIONS: Therapeutic exercises, Therapeutic activity, Neuromuscular re-education, Balance training, Gait training, Patient/Family education, Self Care, Joint mobilization, Stair training, DME instructions, Cognitive remediation, Cryotherapy, Moist heat, Manual therapy, and Re-evaluation  PLAN FOR NEXT SESSION:   Re-assess HEP.  Dynamic balance gait training Large amplitude movement.   Grier Rocher PT, DPT  Physical Therapist - [place]  [facility]  3:22 PM 04/20/23

## 2023-04-22 ENCOUNTER — Ambulatory Visit: Payer: Medicare Other | Admitting: Physical Therapy

## 2023-04-22 DIAGNOSIS — R278 Other lack of coordination: Secondary | ICD-10-CM

## 2023-04-22 DIAGNOSIS — R2689 Other abnormalities of gait and mobility: Secondary | ICD-10-CM

## 2023-04-22 DIAGNOSIS — M6281 Muscle weakness (generalized): Secondary | ICD-10-CM | POA: Diagnosis not present

## 2023-04-22 NOTE — Therapy (Signed)
OUTPATIENT PHYSICAL THERAPY NEURO TREATMENT   Patient Name: Carlos Morrison MRN: [id number] DOB:01/06/1941, 82 y.o., male Today's Date: 04/22/2023   PCP: Barbette Reichmann, MD  REFERRING PROVIDER: Barbette Reichmann, MD   END OF SESSION:  PT End of Session - 04/22/23 1148     Visit Number 3    Number of Visits 24    Date for PT Re-Evaluation 07/07/23    PT Start Time 1149    PT Stop Time 1231    PT Time Calculation (min) 42 min    Equipment Utilized During Treatment Gait belt    Activity Tolerance Patient tolerated treatment well;No increased pain    Behavior During Therapy WFL for tasks assessed/performed             Past Medical History:  Diagnosis Date   Arthritis    Bilateral knees   GERD (gastroesophageal reflux disease)    Hyperlipidemia    Past Surgical History:  Procedure Laterality Date   CATARACT EXTRACTION W/PHACO Left 03/30/2018   Procedure: CATARACT EXTRACTION PHACO AND INTRAOCULAR LENS PLACEMENT (IOC)  LEFT;  Surgeon: Lockie Mola, MD;  Location: [facility];  Service: Ophthalmology;  Laterality: Left;   CATARACT EXTRACTION W/PHACO Right 01/29/2021   Procedure: CATARACT EXTRACTION PHACO AND INTRAOCULAR LENS PLACEMENT (IOC) RIGHT;  Surgeon: Lockie Mola, MD;  Location: [facility];  Service: Ophthalmology;  Laterality: Right;  3.24 1:03.0 5.18%   COLONOSCOPY  2011   Dr. Kizzie Ide   COLONOSCOPY N/A 05/01/2015   Procedure: COLONOSCOPY;  Surgeon: Earline Mayotte, MD;  Location: [facility] END[facility]COPY;  Service: Endoscopy;  Laterality: N/A;   GANGLION CYST EXCISION Left 08/2006   Patient Active Problem List   Diagnosis Date Noted   CN (constipation) 03/19/2015    ONSET DATE: fall 2 weeks ago.    REFERRING DIAG:  R26.89 (ICD-10-CM) - Other abnormalities of gait and mobility  Z91.81 (ICD-10-CM) - History of falling    THERAPY DIAG:  Muscle weakness (generalized)  Other lack of coordination  Other abnormalities of gait and  mobility  Rationale for Evaluation and Treatment: Rehabilitation  SUBJECTIVE:                                                                                                                                                                                             SUBJECTIVE STATEMENT: Pt reports that he is doing well. Following nustep reciprocal movement training, pt reports mild lightheadedness. Also then states that he did not eat or drink much this morning except a banana and a little water with his medication because he was in a hurry.    Pt  accompanied by: significant other [name] Kercheval   PERTINENT HISTORY: recent fall.   PAIN:  Are you having pain? No  PRECAUTIONS: None  WEIGHT BEARING RESTRICTIONS: No  FALLS: Has patient fallen in last 6 months? Yes. Number of falls 1  LIVING ENVIRONMENT: Lives with: lives with their spouse Lives in: House/apartment Stairs: Yes: External: 4 steps; on left going up Has following equipment at home: None  PLOF: Independent and Independent with basic ADLs  PATIENT GOALS: improve balance. Reduce fall risk. Per Wife: improve Arm stretch    OBJECTIVE:   DIAGNOSTIC FINDINGS: EXAM: MRI HEAD WITHOUT CONTRAST  IMPRESSION: No evidence of acute intracranial abnormality.   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   Mild generalized cerebral and cerebellar atrophy.   Mild paranasal sinus disease, as described.   Trace fluid within the bilateral mastoid air cells.    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   EDEMA:  WFL  MUSCLE TONE: WFL   MUSCLE LENGTH: WFL  DTRs:  Patella 2+ = Normal  POSTURE: rounded shoulders, forward head, and flexed trunk   LOWER EXTREMITY ROM:     Active  Right Eval Left Eval  Hip  flexion [facility]  Hip extension    Hip abduction [facility]  Hip adduction [facility]  Knee flexion [facility] WFL  Knee extension WFL Lacking 5 deg full extnesion   Ankle dorsiflexion WFL WFL   (Blank rows = not tested)  LOWER EXTREMITY MMT:    MMT Right Eval Left Eval  Hip flexion 4+ 4+  Hip extension    Hip abduction 4+ 4+  Hip adduction 5 5  Knee flexion 4+ 4+  Knee extension 5 5  Ankle dorsiflexion 4+ 4+  (Blank rows = not tested)  BED MOBILITY:  Sit to supine Complete Independence Supine to sit Complete Independence  TRANSFERS: Assistive device utilized: None  Sit to stand: Complete Independence Stand to sit: Complete Independence Chair to chair: Complete Independence Floor:  to be assessed   RAMP:  Level of Assistance: CGA Assistive device utilized:  none   CURB:  Level of Assistance: CGA Assistive device utilized: None   STAIRS: Level of Assistance: SBA Stair Negotiation Technique: Step to Pattern with Bilateral Rails Number of Stairs: 4  Height of Stairs: 6  Comments:  1 rail with step to ascent, 2 rails with step through descent   GAIT: Gait pattern: step through pattern, decreased arm swing- Right, decreased arm swing- Left, Left foot flat, shuffling, and narrow BOS Distance walked: 1257 in 6 min walk  Assistive device utilized: None Level of assistance: Complete Independence Comments: increased shuffle/festination as well as tremor in LUE/LLE with fatigue   FUNCTIONAL TESTS:  5 times sit to stand: 16.47sec  Timed up and go (TUG): 13.37sec  6 minute walk test: 1257' with no AD 10 meter walk test: 10.67sec(0.74m/s)  Berg Balance Scale: 47 Dynamic Gait Index: 19 Functional gait assessment: 21  PATIENT SURVEYS:  FOTO 55  TODAY'S TREATMENT:  DATE: 04/22/2023    Nustep. X 6 min   Sitting 116/61 HR 94 Standing 91/58  HR 96  symptomatic  Standing 94/56 HR 97 symptomatic  Standing  91/57 HR 98 symptomatic  Sitting 104/64 HR 94  Sitting 108/68 HR 89 Standing 83/53. HR 92.  symptomatic  PT manually assesed HR and pulse rhythm, 92 BPM with regular rhythm   Due mild orthostatic hypotension, PT treatment focused on seated therex on this day  LAQ RTB x 10  Hip abduction RTB  x 12  Hip flexion RTB x12 HS curl RTB x12 Ankle PF GTB x15 Isometric hip adduction on therapy peanut. x12 Foot touch> sitting erect with shoulder abduction and supinatino x 10  Trunkal rotation R and L to hit contralateral hand x 8 bil    PATIENT EDUCATION: Education details: Pt educated throughout session about proper posture and technique with exercises. Improved exercise technique, movement at target joints, use of target muscles after min to mod verbal, visual, tactile cues. POC, Therapy orientation, rehab goals and expectations  Person educated: Patient and Spouse Education method: Explanation and Demonstration Education comprehension: verbalized understanding  HOME EXERCISE PROGRAM:  Access Code: [email or web address] URL: [email or web address] Date: 04/20/2023 Prepared by: Grier Rocher  Exercises - Standing Hip Extension with Resistance at Ankles and Counter Support  - 1 x daily - 5 x weekly - 3 sets - 10 reps - Standing Hip Abduction with Resistance at Ankles and Counter Support  - 1 x daily - 5 x weekly - 3 sets - 10 reps - Marching with Resistance  - 1 x daily - 5 x weekly - 3 sets - 10 reps   SHORT TERM GOALS: Target date: 05/19/2023    Patient will be independent in home exercise program to improve strength/mobility for better functional independence with ADLs. Baseline: to be given at next session  Goal status: INITIAL   LONG TERM GOALS: Target date: 07/07/2023    Patient will increase FOTO score to equal to or greater than  59  to demonstrate statistically significant improvement in mobility and quality of  life.  Baseline: 55 Goal status: INITIAL  2.  Patient (> 31 years old) will complete five times sit to stand test in < 15 seconds indicating an increased LE strength and improved balance. Baseline: 16.47 sec  Goal status: INITIAL  3.  Patient will increase Berg Balance score by > 6 points to demonstrate decreased fall risk during functional activities Baseline: 47/56 Goal status: INITIAL  4.  Patient will increase 10 meter walk test to >1.25m/s as to improve gait speed for better community ambulation and to reduce fall risk. Baseline: 0.33m/s Goal status: INITIAL  5.  Patient will reduce timed up and go to <11 seconds to reduce fall risk and demonstrate improved transfer/gait ability. Baseline: 13.37 Goal status: INITIAL  6.  Patient will increase dynamic gait index score to >19/24 as to demonstrate reduced fall risk and improved dynamic gait balance for better safety with community/home ambulation.   Baseline: 19/24 Goal status: INITIAL  ASSESSMENT:  CLINICAL IMPRESSION: Pt reports to PT with good motivation to all therapeutic activities. Session limited due to orthostatic hypotension. Pt reports that he did not eat or drink much this morning, "1 banana and some water". Session focused on BLE strengthening and PD focused postural and rotational movement emphasizing lange amplitude movements. Pt's wife educated on s/s of CVA and encouraged to seek medical attention with any new s/s for BEFAST or if pt continues to feel  dizzy after eating and drinking at lunch. Will monitor at next treatment session.  Pt will benefit from skilled PT to improve balance, reduce fall risk, improve community access.   OBJECTIVE IMPAIRMENTS: Abnormal gait, cardiopulmonary status limiting activity, decreased activity tolerance, decreased balance, decreased cognition, decreased coordination, decreased endurance, decreased knowledge of condition, decreased mobility, difficulty walking, decreased ROM, decreased  strength, decreased safety awareness, impaired flexibility, impaired UE functional use, and improper body mechanics.   ACTIVITY LIMITATIONS: carrying, standing, and locomotion level  PARTICIPATION LIMITATIONS: meal prep, cleaning, laundry, driving, community activity, yard work, and school  PERSONAL FACTORS: Age, Behavior pattern, Education, Fitness, and Time since onset of injury/illness/exacerbation are also affecting patient's functional outcome.   REHAB POTENTIAL: Good  CLINICAL DECISION MAKING: Stable/uncomplicated  EVALUATION COMPLEXITY: Moderate  PLAN:  PT FREQUENCY: 1-2x/week  PT DURATION: 12 weeks  PLANNED INTERVENTIONS: Therapeutic exercises, Therapeutic activity, Neuromuscular re-education, Balance training, Gait training, Patient/Family education, Self Care, Joint mobilization, Stair training, DME instructions, Cognitive remediation, Cryotherapy, Moist heat, Manual therapy, and Re-evaluation  PLAN FOR NEXT SESSION:   Re-assess HEP.  Dynamic balance gait training Large amplitude movement.   Grier Rocher PT, DPT  Physical Therapist - [place]  [facility]  3:22 PM 04/22/23

## 2023-04-27 ENCOUNTER — Ambulatory Visit: Payer: Medicare Other | Admitting: Physical Therapy

## 2023-04-27 DIAGNOSIS — R2689 Other abnormalities of gait and mobility: Secondary | ICD-10-CM

## 2023-04-27 DIAGNOSIS — R278 Other lack of coordination: Secondary | ICD-10-CM

## 2023-04-27 DIAGNOSIS — M6281 Muscle weakness (generalized): Secondary | ICD-10-CM | POA: Diagnosis not present

## 2023-04-27 NOTE — Therapy (Signed)
OUTPATIENT PHYSICAL THERAPY NEURO TREATMENT   Patient Name: Carlos Morrison MRN: [id number] DOB:1941/03/13, 82 y.o., male Today's Date: 04/27/2023   PCP: Barbette Reichmann, MD  REFERRING PROVIDER: Barbette Reichmann, MD   END OF SESSION:  PT End of Session - 04/27/23 1104     Visit Number 4    Number of Visits 24    Date for PT Re-Evaluation 07/07/23    PT Start Time 1103    PT Stop Time 1146    PT Time Calculation (min) 43 min    Equipment Utilized During Treatment Gait belt    Activity Tolerance Patient tolerated treatment well;No increased pain    Behavior During Therapy WFL for tasks assessed/performed             Past Medical History:  Diagnosis Date   Arthritis    Bilateral knees   GERD (gastroesophageal reflux disease)    Hyperlipidemia    Past Surgical History:  Procedure Laterality Date   CATARACT EXTRACTION W/PHACO Left 03/30/2018   Procedure: CATARACT EXTRACTION PHACO AND INTRAOCULAR LENS PLACEMENT (IOC)  LEFT;  Surgeon: Lockie Mola, MD;  Location: [facility];  Service: Ophthalmology;  Laterality: Left;   CATARACT EXTRACTION W/PHACO Right 01/29/2021   Procedure: CATARACT EXTRACTION PHACO AND INTRAOCULAR LENS PLACEMENT (IOC) RIGHT;  Surgeon: Lockie Mola, MD;  Location: [facility];  Service: Ophthalmology;  Laterality: Right;  3.24 1:03.0 5.18%   COLONOSCOPY  2011   Dr. Kizzie Ide   COLONOSCOPY N/A 05/01/2015   Procedure: COLONOSCOPY;  Surgeon: Earline Mayotte, MD;  Location: [facility];  Service: Endoscopy;  Laterality: N/A;   GANGLION CYST EXCISION Left 08/2006   Patient Active Problem List   Diagnosis Date Noted   CN (constipation) 03/19/2015    ONSET DATE: fall 2 weeks ago.    REFERRING DIAG:  R26.89 (ICD-10-CM) - Other abnormalities of gait and mobility  Z91.81 (ICD-10-CM) - History of falling    THERAPY DIAG:  Muscle weakness (generalized)  Other lack of coordination  Other abnormalities of gait and  mobility  Rationale for Evaluation and Treatment: Rehabilitation  SUBJECTIVE:                                                                                                                                                                                             SUBJECTIVE STATEMENT: Pt reports that he is doing well. Pt reports that he continued to have a little lightheadedness following last treatment, but resolved after eating and drinking on that day. Pt reports that he has been monitoring BP, and it is doing better since last session.    Pt  accompanied by: significant other [name] Steege   PERTINENT HISTORY: recent fall.   PAIN:  Are you having pain? No  PRECAUTIONS: None  WEIGHT BEARING RESTRICTIONS: No  FALLS: Has patient fallen in last 6 months? Yes. Number of falls 1  LIVING ENVIRONMENT: Lives with: lives with their spouse Lives in: House/apartment Stairs: Yes: External: 4 steps; on left going up Has following equipment at home: None  PLOF: Independent and Independent with basic ADLs  PATIENT GOALS: improve balance. Reduce fall risk. Per Wife: improve Arm stretch    OBJECTIVE:   DIAGNOSTIC FINDINGS: EXAM: MRI HEAD WITHOUT CONTRAST  IMPRESSION: No evidence of acute intracranial abnormality.   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   Mild generalized cerebral and cerebellar atrophy.   Mild paranasal sinus disease, as described.   Trace fluid within the bilateral mastoid air cells.    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   EDEMA:  WFL  MUSCLE TONE: WFL   MUSCLE LENGTH: WFL  DTRs:  Patella 2+ = Normal  POSTURE: rounded shoulders, forward head, and flexed trunk   LOWER EXTREMITY ROM:     Active  Right Eval Left Eval  Hip  flexion [facility]  Hip extension    Hip abduction [facility]  Hip adduction [facility]  Knee flexion [facility] WFL  Knee extension WFL Lacking 5 deg full extnesion   Ankle dorsiflexion WFL WFL   (Blank rows = not tested)  LOWER EXTREMITY MMT:    MMT Right Eval Left Eval  Hip flexion 4+ 4+  Hip extension    Hip abduction 4+ 4+  Hip adduction 5 5  Knee flexion 4+ 4+  Knee extension 5 5  Ankle dorsiflexion 4+ 4+  (Blank rows = not tested)  BED MOBILITY:  Sit to supine Complete Independence Supine to sit Complete Independence  TRANSFERS: Assistive device utilized: None  Sit to stand: Complete Independence Stand to sit: Complete Independence Chair to chair: Complete Independence Floor:  to be assessed   RAMP:  Level of Assistance: CGA Assistive device utilized:  none   CURB:  Level of Assistance: CGA Assistive device utilized: None   STAIRS: Level of Assistance: SBA Stair Negotiation Technique: Step to Pattern with Bilateral Rails Number of Stairs: 4  Height of Stairs: 6  Comments:  1 rail with step to ascent, 2 rails with step through descent   GAIT: Gait pattern: step through pattern, decreased arm swing- Right, decreased arm swing- Left, Left foot flat, shuffling, and narrow BOS Distance walked: 1257 in 6 min walk  Assistive device utilized: None Level of assistance: Complete Independence Comments: increased shuffle/festination as well as tremor in LUE/LLE with fatigue   FUNCTIONAL TESTS:  5 times sit to stand: 16.47sec  Timed up and go (TUG): 13.37sec  6 minute walk test: 1257' with no AD 10 meter walk test: 10.67sec(0.57m/s)  Berg Balance Scale: 47 Dynamic Gait Index: 19 Functional gait assessment: 21  PATIENT SURVEYS:  FOTO 55  TODAY'S TREATMENT:  DATE: 04/27/2023     Orthostatic VS assessment:  Sitting 107/66 HR 88 Standing 99/63 HR  94 Gait with no resistance x 1051ft with cues for improved arm swing  Following 1032ft gait training 102/66 HR 101 . Pt remained asymptomatic for orthostatic s/s throughout assessment or through gait training.   Performed at rail on wall with 4 lb ankle weight:  Reciprocal foot taps on 6inch step x 15 bil  Reciprocal foot tap with contralateral UE cross body reach to touch target,  x 12 bil  1 foot on 6inch step, BUE diagonals with ball x 12 bil  Forward reciprocal step over half bolster, x 15 bil  Lateral step with 1 LE over half bolster, x 12bil  Lateral steps over cane with BLE x 12 bil    Throughout session, PT provided CGA for safety to reduce fall risk and verbal and tactile cues for improved trunkal rotation and UE swing as appropriate.   PATIENT EDUCATION: Education details: Pt educated throughout session about proper posture and technique with exercises. Improved exercise technique, movement at target joints, use of target muscles after min to mod verbal, visual, tactile cues. POC, Therapy orientation, rehab goals and expectations  Person educated: Patient and Spouse Education method: Explanation and Demonstration Education comprehension: verbalized understanding  HOME EXERCISE PROGRAM:  Access Code: [email or web address] URL: [email or web address] Date: 04/20/2023 Prepared by: Grier Rocher  Exercises - Standing Hip Extension with Resistance at Ankles and Counter Support  - 1 x daily - 5 x weekly - 3 sets - 10 reps - Standing Hip Abduction with Resistance at Ankles and Counter Support  - 1 x daily - 5 x weekly - 3 sets - 10 reps - Marching with Resistance  - 1 x daily - 5 x weekly - 3 sets - 10 reps   SHORT TERM GOALS: Target date: 05/19/2023    Patient will be independent in home exercise program to improve strength/mobility for better functional independence with ADLs. Baseline: to be given at next session  Goal status: INITIAL   LONG TERM GOALS: Target date:  07/07/2023    Patient will increase FOTO score to equal to or greater than  59  to demonstrate statistically significant improvement in mobility and quality of life.  Baseline: 55 Goal status: INITIAL  2.  Patient (> 21 years old) will complete five times sit to stand test in < 15 seconds indicating an increased LE strength and improved balance. Baseline: 16.47 sec  Goal status: INITIAL  3.  Patient will increase Berg Balance score by > 6 points to demonstrate decreased fall risk during functional activities Baseline: 47/56 Goal status: INITIAL  4.  Patient will increase 10 meter walk test to >1.47m/s as to improve gait speed for better community ambulation and to reduce fall risk. Baseline: 0.8m/s Goal status: INITIAL  5.  Patient will reduce timed up and go to <11 seconds to reduce fall risk and demonstrate improved transfer/gait ability. Baseline: 13.37 Goal status: INITIAL  6.  Patient will increase dynamic gait index score to >19/24 as to demonstrate reduced fall risk and improved dynamic gait balance for better safety with community/home ambulation.   Baseline: 19/24 Goal status: INITIAL  ASSESSMENT:  CLINICAL IMPRESSION: Pt reports to PT with good motivation to all therapeutic activities. Pt reports that he feels better than he did at last PT treatment. PT session focused on improved trunk and UE swing as appropriate as well as increased step length and height. Pt demonstrated mild instability with  reciprocal foot tap and stepping over bolster requiring CGA to prevent lateral LOB. Pt able to demonstrate improved UE swing but required continued cues from PT to maintain.  Pt will benefit from skilled PT to improve balance, reduce fall risk, improve community access.   OBJECTIVE IMPAIRMENTS: Abnormal gait, cardiopulmonary status limiting activity, decreased activity tolerance, decreased balance, decreased cognition, decreased coordination, decreased endurance, decreased knowledge  of condition, decreased mobility, difficulty walking, decreased ROM, decreased strength, decreased safety awareness, impaired flexibility, impaired UE functional use, and improper body mechanics.   ACTIVITY LIMITATIONS: carrying, standing, and locomotion level  PARTICIPATION LIMITATIONS: meal prep, cleaning, laundry, driving, community activity, yard work, and school  PERSONAL FACTORS: Age, Behavior pattern, Education, Fitness, and Time since onset of injury/illness/exacerbation are also affecting patient's functional outcome.   REHAB POTENTIAL: Good  CLINICAL DECISION MAKING: Stable/uncomplicated  EVALUATION COMPLEXITY: Moderate  PLAN:  PT FREQUENCY: 1-2x/week  PT DURATION: 12 weeks  PLANNED INTERVENTIONS: Therapeutic exercises, Therapeutic activity, Neuromuscular re-education, Balance training, Gait training, Patient/Family education, Self Care, Joint mobilization, Stair training, DME instructions, Cognitive remediation, Cryotherapy, Moist heat, Manual therapy, and Re-evaluation  PLAN FOR NEXT SESSION:   Endurance training.  Dynamic balance gait training Large amplitude movement.   Grier Rocher PT, DPT  Physical Therapist - [facility]  12:25 PM 04/27/23

## 2023-04-29 ENCOUNTER — Encounter: Payer: Self-pay | Admitting: Physical Therapy

## 2023-04-29 ENCOUNTER — Ambulatory Visit: Payer: Medicare Other | Admitting: Physical Therapy

## 2023-04-29 DIAGNOSIS — R2689 Other abnormalities of gait and mobility: Secondary | ICD-10-CM

## 2023-04-29 DIAGNOSIS — M6281 Muscle weakness (generalized): Secondary | ICD-10-CM

## 2023-04-29 DIAGNOSIS — R278 Other lack of coordination: Secondary | ICD-10-CM

## 2023-04-29 NOTE — Therapy (Signed)
OUTPATIENT PHYSICAL THERAPY NEURO TREATMENT   Patient Name: Carlos Morrison MRN: [id number] DOB:December 18, 1940, 82 y.o., male Today's Date: 04/29/2023   PCP: Barbette Reichmann, MD  REFERRING PROVIDER: Barbette Reichmann, MD   END OF SESSION:  PT End of Session - 04/29/23 1201     Visit Number 5    Number of Visits 24    Date for PT Re-Evaluation 07/07/23    PT Start Time 1147    PT Stop Time 1230    PT Time Calculation (min) 43 min    Equipment Utilized During Treatment Gait belt    Activity Tolerance Patient tolerated treatment well;No increased pain    Behavior During Therapy WFL for tasks assessed/performed             Past Medical History:  Diagnosis Date   Arthritis    Bilateral knees   GERD (gastroesophageal reflux disease)    Hyperlipidemia    Past Surgical History:  Procedure Laterality Date   CATARACT EXTRACTION W/PHACO Left 03/30/2018   Procedure: CATARACT EXTRACTION PHACO AND INTRAOCULAR LENS PLACEMENT (IOC)  LEFT;  Surgeon: Lockie Mola, MD;  Location: [facility];  Service: Ophthalmology;  Laterality: Left;   CATARACT EXTRACTION W/PHACO Right 01/29/2021   Procedure: CATARACT EXTRACTION PHACO AND INTRAOCULAR LENS PLACEMENT (IOC) RIGHT;  Surgeon: Lockie Mola, MD;  Location: [facility];  Service: Ophthalmology;  Laterality: Right;  3.24 1:03.0 5.18%   COLONOSCOPY  2011   Dr. Kizzie Ide   COLONOSCOPY N/A 05/01/2015   Procedure: COLONOSCOPY;  Surgeon: Earline Mayotte, MD;  Location: [facility];  Service: Endoscopy;  Laterality: N/A;   GANGLION CYST EXCISION Left 08/2006   Patient Active Problem List   Diagnosis Date Noted   CN (constipation) 03/19/2015    ONSET DATE: fall 2 weeks ago.    REFERRING DIAG:  R26.89 (ICD-10-CM) - Other abnormalities of gait and mobility  Z91.81 (ICD-10-CM) - History of falling    THERAPY DIAG:  Muscle weakness (generalized)  Other lack of coordination  Other abnormalities of gait and  mobility  Rationale for Evaluation and Treatment: Rehabilitation  SUBJECTIVE:                                                                                                                                                                                             SUBJECTIVE STATEMENT: Pt reports that he is doing well. No dizziness noted since last session. No other updates.    Pt accompanied by: significant other [name] Grunow   PERTINENT HISTORY: recent fall.   PAIN:  Are you having pain? No  PRECAUTIONS: None  WEIGHT BEARING RESTRICTIONS: No  FALLS: Has patient fallen in last 6 months? Yes. Number of falls 1  LIVING ENVIRONMENT: Lives with: lives with their spouse Lives in: House/apartment Stairs: Yes: External: 4 steps; on left going up Has following equipment at home: None  PLOF: Independent and Independent with basic ADLs  PATIENT GOALS: improve balance. Reduce fall risk. Per Wife: improve Arm stretch    OBJECTIVE:   DIAGNOSTIC FINDINGS: EXAM: MRI HEAD WITHOUT CONTRAST  IMPRESSION: No evidence of acute intracranial abnormality.   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   Mild generalized cerebral and cerebellar atrophy.   Mild paranasal sinus disease, as described.   Trace fluid within the bilateral mastoid air cells.    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   EDEMA:  WFL  MUSCLE TONE: WFL   MUSCLE LENGTH: WFL  DTRs:  Patella 2+ = Normal  POSTURE: rounded shoulders, forward head, and flexed trunk   LOWER EXTREMITY ROM:     Active  Right Eval Left Eval  Hip flexion [facility]  Hip extension    Hip abduction [facility]  Hip adduction [facility]  Knee flexion [facility] WFL  Knee extension WFL Lacking 5 deg full extnesion   Ankle  dorsiflexion WFL WFL   (Blank rows = not tested)  LOWER EXTREMITY MMT:    MMT Right Eval Left Eval  Hip flexion 4+ 4+  Hip extension    Hip abduction 4+ 4+  Hip adduction 5 5  Knee flexion 4+ 4+  Knee extension 5 5  Ankle dorsiflexion 4+ 4+  (Blank rows = not tested)  BED MOBILITY:  Sit to supine Complete Independence Supine to sit Complete Independence  TRANSFERS: Assistive device utilized: None  Sit to stand: Complete Independence Stand to sit: Complete Independence Chair to chair: Complete Independence Floor:  to be assessed   RAMP:  Level of Assistance: CGA Assistive device utilized:  none   CURB:  Level of Assistance: CGA Assistive device utilized: None   STAIRS: Level of Assistance: SBA Stair Negotiation Technique: Step to Pattern with Bilateral Rails Number of Stairs: 4  Height of Stairs: 6  Comments:  1 rail with step to ascent, 2 rails with step through descent   GAIT: Gait pattern: step through pattern, decreased arm swing- Right, decreased arm swing- Left, Left foot flat, shuffling, and narrow BOS Distance walked: 1257 in 6 min walk  Assistive device utilized: None Level of assistance: Complete Independence Comments: increased shuffle/festination as well as tremor in LUE/LLE with fatigue   FUNCTIONAL TESTS:  5 times sit to stand: 16.47sec  Timed up and go (TUG): 13.37sec  6 minute walk test: 1257' with no AD 10 meter walk test: 10.67sec(0.8m/s)  Berg Balance Scale: 47 Dynamic Gait Index: 19 Functional gait assessment: 21  PATIENT SURVEYS:  FOTO 55  TODAY'S TREATMENT:  DATE: 04/29/2023    Nustep reciprocal movement training x 6 min level 2-4 with cues for full ROM on the R side.   Pt has no complaints of orthostatic hypotension on this day.  VS assessed following sit<>stand exercise:  133/72 HR82   Reciprocal  step over 2 hedgehogs on ground x 10 bil no UE support Lateral step over hedge hogs x 10 bil no UE support  Tandem stance 2 kx 20 sec bil with min assist from PT intermittently to prevent L LOB.   Standing with tall posture pt preforms sit<>to stand motion while reaching forwards with outstretched hands. Then when transitioning to standing upright the pt simultaneously extends arms back and out towards sides. 3 x 10 reps.  Standing lateral step with 1 UE reaching out and up towards ceiling with supination then complete on opposite side. 3 x 10 reps ea side.   Gait training without resistance x 149ft x 3 with emphasis on improved step length and arm swing. Pt noted to have improved carryover to maintain full arm swing on this day compared to prior session.    Throughout session, PT provided CGA for safety to reduce fall risk and verbal and tactile cues for improved trunkal rotation and UE swing as appropriate.   PATIENT EDUCATION: Education details: Pt educated throughout session about proper posture and technique with exercises. Improved exercise technique, movement at target joints, use of target muscles after min to mod verbal, visual, tactile cues. POC, Therapy orientation, rehab goals and expectations  Person educated: Patient and Spouse Education method: Explanation and Demonstration Education comprehension: verbalized understanding  HOME EXERCISE PROGRAM:  Access Code: [email or web address] URL: [email or web address] Date: 04/20/2023 Prepared by: Grier Rocher  Exercises - Standing Hip Extension with Resistance at Ankles and Counter Support  - 1 x daily - 5 x weekly - 3 sets - 10 reps - Standing Hip Abduction with Resistance at Ankles and Counter Support  - 1 x daily - 5 x weekly - 3 sets - 10 reps - Marching with Resistance  - 1 x daily - 5 x weekly - 3 sets - 10 reps   SHORT TERM GOALS: Target date: 05/19/2023    Patient will be independent in home exercise program to  improve strength/mobility for better functional independence with ADLs. Baseline: to be given at next session  Goal status: INITIAL   LONG TERM GOALS: Target date: 07/07/2023    Patient will increase FOTO score to equal to or greater than  59  to demonstrate statistically significant improvement in mobility and quality of life.  Baseline: 55 Goal status: INITIAL  2.  Patient (> 33 years old) will complete five times sit to stand test in < 15 seconds indicating an increased LE strength and improved balance. Baseline: 16.47 sec  Goal status: INITIAL  3.  Patient will increase Berg Balance score by > 6 points to demonstrate decreased fall risk during functional activities Baseline: 47/56 Goal status: INITIAL  4.  Patient will increase 10 meter walk test to >1.35m/s as to improve gait speed for better community ambulation and to reduce fall risk. Baseline: 0.58m/s Goal status: INITIAL  5.  Patient will reduce timed up and go to <11 seconds to reduce fall risk and demonstrate improved transfer/gait ability. Baseline: 13.37 Goal status: INITIAL  6.  Patient will increase dynamic gait index score to >19/24 as to demonstrate reduced fall risk and improved dynamic gait balance for better safety with community/home ambulation.   Baseline: 19/24 Goal status:  INITIAL  ASSESSMENT:  CLINICAL IMPRESSION: Pt reports to PT with good motivation to all therapeutic activities. PT treatment focused on improved step length and trunk/arm swing throughout session as well as improved righting reaction with static balance. Pt noted to have L bias until utilization of visual feed back from mirror. Improved UE opening with increased repetition of modified PWR movement in standing. Pt will benefit from skilled PT to improve balance, reduce fall risk, improve community access.   OBJECTIVE IMPAIRMENTS: Abnormal gait, cardiopulmonary status limiting activity, decreased activity tolerance, decreased balance,  decreased cognition, decreased coordination, decreased endurance, decreased knowledge of condition, decreased mobility, difficulty walking, decreased ROM, decreased strength, decreased safety awareness, impaired flexibility, impaired UE functional use, and improper body mechanics.   ACTIVITY LIMITATIONS: carrying, standing, and locomotion level  PARTICIPATION LIMITATIONS: meal prep, cleaning, laundry, driving, community activity, yard work, and school  PERSONAL FACTORS: Age, Behavior pattern, Education, Fitness, and Time since onset of injury/illness/exacerbation are also affecting patient's functional outcome.   REHAB POTENTIAL: Good  CLINICAL DECISION MAKING: Stable/uncomplicated  EVALUATION COMPLEXITY: Moderate  PLAN:  PT FREQUENCY: 1-2x/week  PT DURATION: 12 weeks  PLANNED INTERVENTIONS: Therapeutic exercises, Therapeutic activity, Neuromuscular re-education, Balance training, Gait training, Patient/Family education, Self Care, Joint mobilization, Stair training, DME instructions, Cognitive remediation, Cryotherapy, Moist heat, Manual therapy, and Re-evaluation  PLAN FOR NEXT SESSION:   Weighted gait training.  Large amplitude movement.   Grier Rocher PT, DPT  Physical Therapist - [place]  [facility]  1:42 PM 04/29/23

## 2023-05-04 ENCOUNTER — Ambulatory Visit: Payer: Medicare Other

## 2023-05-04 DIAGNOSIS — M6281 Muscle weakness (generalized): Secondary | ICD-10-CM | POA: Diagnosis not present

## 2023-05-04 DIAGNOSIS — R278 Other lack of coordination: Secondary | ICD-10-CM

## 2023-05-04 DIAGNOSIS — R2689 Other abnormalities of gait and mobility: Secondary | ICD-10-CM

## 2023-05-04 NOTE — Therapy (Signed)
OUTPATIENT PHYSICAL THERAPY NEURO TREATMENT   Patient Name: Carlos Morrison MRN: [id number] DOB:April 24, 1941, 82 y.o., male Today's Date: 05/04/2023   PCP: Barbette Reichmann, MD  REFERRING PROVIDER: Barbette Reichmann, MD   END OF SESSION:  PT End of Session - 05/04/23 1312     Visit Number 6    Number of Visits 24    Date for PT Re-Evaluation 07/07/23    PT Start Time 1314    PT Stop Time 1359    PT Time Calculation (min) 45 min    Equipment Utilized During Treatment Gait belt    Activity Tolerance Patient tolerated treatment well;No increased pain    Behavior During Therapy WFL for tasks assessed/performed              Past Medical History:  Diagnosis Date   Arthritis    Bilateral knees   GERD (gastroesophageal reflux disease)    Hyperlipidemia    Past Surgical History:  Procedure Laterality Date   CATARACT EXTRACTION W/PHACO Left 03/30/2018   Procedure: CATARACT EXTRACTION PHACO AND INTRAOCULAR LENS PLACEMENT (IOC)  LEFT;  Surgeon: Lockie Mola, MD;  Location: [facility];  Service: Ophthalmology;  Laterality: Left;   CATARACT EXTRACTION W/PHACO Right 01/29/2021   Procedure: CATARACT EXTRACTION PHACO AND INTRAOCULAR LENS PLACEMENT (IOC) RIGHT;  Surgeon: Lockie Mola, MD;  Location: [facility];  Service: Ophthalmology;  Laterality: Right;  3.24 1:03.0 5.18%   COLONOSCOPY  2011   Dr. Kizzie Ide   COLONOSCOPY N/A 05/01/2015   Procedure: COLONOSCOPY;  Surgeon: Earline Mayotte, MD;  Location: [facility];  Service: Endoscopy;  Laterality: N/A;   GANGLION CYST EXCISION Left 08/2006   Patient Active Problem List   Diagnosis Date Noted   CN (constipation) 03/19/2015    ONSET DATE: fall 2 weeks ago.    REFERRING DIAG:  R26.89 (ICD-10-CM) - Other abnormalities of gait and mobility  Z91.81 (ICD-10-CM) - History of falling    THERAPY DIAG:  Muscle weakness (generalized)  Other lack of coordination  Other abnormalities of gait and  mobility  Rationale for Evaluation and Treatment: Rehabilitation  SUBJECTIVE:                                                                                                                                                                                             SUBJECTIVE STATEMENT: Patient reports no falls or LOB since last session.    Pt accompanied by: significant other [name] Georg   PERTINENT HISTORY: recent fall.   PAIN:  Are you having pain? No  PRECAUTIONS: None  WEIGHT BEARING RESTRICTIONS: No  FALLS: Has patient fallen in  last 6 months? Yes. Number of falls 1  LIVING ENVIRONMENT: Lives with: lives with their spouse Lives in: House/apartment Stairs: Yes: External: 4 steps; on left going up Has following equipment at home: None  PLOF: Independent and Independent with basic ADLs  PATIENT GOALS: improve balance. Reduce fall risk. Per Wife: improve Arm stretch    OBJECTIVE:   DIAGNOSTIC FINDINGS: EXAM: MRI HEAD WITHOUT CONTRAST  IMPRESSION: No evidence of acute intracranial abnormality.   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   Mild generalized cerebral and cerebellar atrophy.   Mild paranasal sinus disease, as described.   Trace fluid within the bilateral mastoid air cells.    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   EDEMA:  WFL  MUSCLE TONE: WFL   MUSCLE LENGTH: WFL  DTRs:  Patella 2+ = Normal  POSTURE: rounded shoulders, forward head, and flexed trunk   LOWER EXTREMITY ROM:     Active  Right Eval Left Eval  Hip flexion [facility]  Hip extension    Hip abduction [facility]  Hip adduction [facility]  Knee flexion [facility] WFL  Knee extension WFL Lacking 5 deg full extnesion   Ankle dorsiflexion WFL WFL   (Blank rows = not  tested)  LOWER EXTREMITY MMT:    MMT Right Eval Left Eval  Hip flexion 4+ 4+  Hip extension    Hip abduction 4+ 4+  Hip adduction 5 5  Knee flexion 4+ 4+  Knee extension 5 5  Ankle dorsiflexion 4+ 4+  (Blank rows = not tested)  BED MOBILITY:  Sit to supine Complete Independence Supine to sit Complete Independence  TRANSFERS: Assistive device utilized: None  Sit to stand: Complete Independence Stand to sit: Complete Independence Chair to chair: Complete Independence Floor:  to be assessed   RAMP:  Level of Assistance: CGA Assistive device utilized:  none   CURB:  Level of Assistance: CGA Assistive device utilized: None   STAIRS: Level of Assistance: SBA Stair Negotiation Technique: Step to Pattern with Bilateral Rails Number of Stairs: 4  Height of Stairs: 6  Comments:  1 rail with step to ascent, 2 rails with step through descent   GAIT: Gait pattern: step through pattern, decreased arm swing- Right, decreased arm swing- Left, Left foot flat, shuffling, and narrow BOS Distance walked: 1257 in 6 min walk  Assistive device utilized: None Level of assistance: Complete Independence Comments: increased shuffle/festination as well as tremor in LUE/LLE with fatigue   FUNCTIONAL TESTS:  5 times sit to stand: 16.47sec  Timed up and go (TUG): 13.37sec  6 minute walk test: 1257' with no AD 10 meter walk test: 10.67sec(0.38m/s)  Berg Balance Scale: 47 Dynamic Gait Index: 19 Functional gait assessment: 21  PATIENT SURVEYS:  FOTO 55  TODAY'S TREATMENT:  DATE: 05/04/2023      Pt has no complaints of orthostatic hypotension on this day.  VS assessed at start of session:  126/71 HR88   Neuro RE-ed: Large step and clap over theraband on floor and return 10x each LE  Standing lateral step with 1 UE reaching out and up towards ceiling with  supination then complete on opposite side. 2 x 10 reps ea side.   Lateral step on airex balance beam 4x length  Airex balance beam: static stand with PVC chest press 10x, overhead press 10x   Speed ladder: one foot per square for large step length x 10 trials ; decreasing UE support to no UE support.  Activity Description: 3 on floor 3 on table Activity Setting:  The Blaze Pod Random setting was chosen to enhance cognitive processing and agility, providing an unpredictable environment to simulate real-world scenarios, and fostering quick reactions and adaptability.   Number of Pods:  6 Cycles/Sets:  3 Duration (Time or Hit Count):  30 seconds   Patient Stats   Hits:   22,25,25   TherEx:  Sit to stand ; cross body punch to mitts x 10 seconds x 3 trials.   Gait training without resistance x 550 ft with emphasis on improved step length and arm swing. Pt noted to have improved carryover to maintain full arm swing on this day compared to prior session. 1.5 lb ankle weights on arms   Throughout session, PT provided CGA for safety to reduce fall risk and verbal and tactile cues for improved trunkal rotation and UE swing as appropriate.   PATIENT EDUCATION: Education details: Pt educated throughout session about proper posture and technique with exercises. Improved exercise technique, movement at target joints, use of target muscles after min to mod verbal, visual, tactile cues. POC, Therapy orientation, rehab goals and expectations  Person educated: Patient and Spouse Education method: Explanation and Demonstration Education comprehension: verbalized understanding  HOME EXERCISE PROGRAM:  Access Code: [email or web address] URL: [email or web address] Date: 04/20/2023 Prepared by: Grier Rocher  Exercises - Standing Hip Extension with Resistance at Ankles and Counter Support  - 1 x daily - 5 x weekly - 3 sets - 10 reps - Standing Hip Abduction with Resistance at Ankles and Counter  Support  - 1 x daily - 5 x weekly - 3 sets - 10 reps - Marching with Resistance  - 1 x daily - 5 x weekly - 3 sets - 10 reps   SHORT TERM GOALS: Target date: 05/19/2023    Patient will be independent in home exercise program to improve strength/mobility for better functional independence with ADLs. Baseline: to be given at next session  Goal status: INITIAL   LONG TERM GOALS: Target date: 07/07/2023    Patient will increase FOTO score to equal to or greater than  59  to demonstrate statistically significant improvement in mobility and quality of life.  Baseline: 55 Goal status: INITIAL  2.  Patient (> 57 years old) will complete five times sit to stand test in < 15 seconds indicating an increased LE strength and improved balance. Baseline: 16.47 sec  Goal status: INITIAL  3.  Patient will increase Berg Balance score by > 6 points to demonstrate decreased fall risk during functional activities Baseline: 47/56 Goal status: INITIAL  4.  Patient will increase 10 meter walk test to >1.92m/s as to improve gait speed for better community ambulation and to reduce fall risk. Baseline: 0.64m/s Goal status: INITIAL  5.  Patient will reduce  timed up and go to <11 seconds to reduce fall risk and demonstrate improved transfer/gait ability. Baseline: 13.37 Goal status: INITIAL  6.  Patient will increase dynamic gait index score to >19/24 as to demonstrate reduced fall risk and improved dynamic gait balance for better safety with community/home ambulation.   Baseline: 19/24 Goal status: INITIAL  ASSESSMENT:  CLINICAL IMPRESSION:  Patient tolerates progressive strengthening and stability interventions well with focus on increasing step length and arm swing. He is highly motivated throughout session. He requires intermittent cueing for task orientation. He initially was challenged with large steps with speed ladder but improved with repetition. Pt will benefit from skilled PT to improve  balance, reduce fall risk, improve community access.   OBJECTIVE IMPAIRMENTS: Abnormal gait, cardiopulmonary status limiting activity, decreased activity tolerance, decreased balance, decreased cognition, decreased coordination, decreased endurance, decreased knowledge of condition, decreased mobility, difficulty walking, decreased ROM, decreased strength, decreased safety awareness, impaired flexibility, impaired UE functional use, and improper body mechanics.   ACTIVITY LIMITATIONS: carrying, standing, and locomotion level  PARTICIPATION LIMITATIONS: meal prep, cleaning, laundry, driving, community activity, yard work, and school  PERSONAL FACTORS: Age, Behavior pattern, Education, Fitness, and Time since onset of injury/illness/exacerbation are also affecting patient's functional outcome.   REHAB POTENTIAL: Good  CLINICAL DECISION MAKING: Stable/uncomplicated  EVALUATION COMPLEXITY: Moderate  PLAN:  PT FREQUENCY: 1-2x/week  PT DURATION: 12 weeks  PLANNED INTERVENTIONS: Therapeutic exercises, Therapeutic activity, Neuromuscular re-education, Balance training, Gait training, Patient/Family education, Self Care, Joint mobilization, Stair training, DME instructions, Cognitive remediation, Cryotherapy, Moist heat, Manual therapy, and Re-evaluation  PLAN FOR NEXT SESSION:   Weighted gait training.  Large amplitude movement.   Precious Bard, PT, DPT Physical Therapist - [facility]  Outpatient Physical Therapy- Main Campus [phone]     2:02 PM 05/04/23

## 2023-05-06 ENCOUNTER — Ambulatory Visit: Payer: Medicare Other | Admitting: Physical Therapy

## 2023-05-06 DIAGNOSIS — M6281 Muscle weakness (generalized): Secondary | ICD-10-CM | POA: Diagnosis not present

## 2023-05-06 DIAGNOSIS — R2689 Other abnormalities of gait and mobility: Secondary | ICD-10-CM

## 2023-05-06 DIAGNOSIS — R278 Other lack of coordination: Secondary | ICD-10-CM

## 2023-05-06 NOTE — Therapy (Signed)
OUTPATIENT PHYSICAL THERAPY NEURO TREATMENT   Patient Name: Carlos Morrison MRN: [id number] DOB:[birth year], 82 y.o., male Today's Date: 05/06/2023   PCP: Barbette Reichmann, MD  REFERRING PROVIDER: Barbette Reichmann, MD   END OF SESSION:  PT End of Session - 05/06/23 1150     Visit Number 7    Number of Visits 24    Date for PT Re-Evaluation 07/07/23    Progress Note Due on Visit 10    PT Start Time 1147    PT Stop Time 1228    PT Time Calculation (min) 41 min    Equipment Utilized During Treatment Gait belt    Activity Tolerance Patient tolerated treatment well;No increased pain    Behavior During Therapy WFL for tasks assessed/performed               Past Medical History:  Diagnosis Date   Arthritis    Bilateral knees   GERD (gastroesophageal reflux disease)    Hyperlipidemia    Past Surgical History:  Procedure Laterality Date   CATARACT EXTRACTION W/PHACO Left 03/30/2018   Procedure: CATARACT EXTRACTION PHACO AND INTRAOCULAR LENS PLACEMENT (IOC)  LEFT;  Surgeon: Lockie Mola, MD;  Location: [facility];  Service: Ophthalmology;  Laterality: Left;   CATARACT EXTRACTION W/PHACO Right 01/29/2021   Procedure: CATARACT EXTRACTION PHACO AND INTRAOCULAR LENS PLACEMENT (IOC) RIGHT;  Surgeon: Lockie Mola, MD;  Location: [facility];  Service: Ophthalmology;  Laterality: Right;  3.24 1:03.0 5.18%   COLONOSCOPY  2011   Dr. Kizzie Ide   COLONOSCOPY N/A 05/01/2015   Procedure: COLONOSCOPY;  Surgeon: Earline Mayotte, MD;  [facility];  Service: Endoscopy;  Laterality: N/A;   GANGLION CYST EXCISION Left 08/2006   Patient Active Problem List   Diagnosis Date Noted   CN (constipation) 03/19/2015    ONSET DATE: fall 2 weeks ago.    REFERRING DIAG:  R26.89 (ICD-10-CM) - Other abnormalities of gait and mobility  Z91.81 (ICD-10-CM) - History of falling    THERAPY DIAG:  Muscle weakness (generalized)  Other abnormalities of  gait and mobility  Other lack of coordination  Rationale for Evaluation and Treatment: Rehabilitation  SUBJECTIVE:                                                                                                                                                                                             SUBJECTIVE STATEMENT:  Pt reports doing well today. Pt denies any recent falls/stumbles since prior session. Pt denies any updates to medications or medical appointment since prior session. Pt reports good compliance with HEP when time permits.  Pt accompanied by: significant other [name] Winegarden   PERTINENT HISTORY: recent fall.   PAIN:  Are you having pain? No  PRECAUTIONS: None  WEIGHT BEARING RESTRICTIONS: No  FALLS: Has patient fallen in last 6 months? Yes. Number of falls 1  LIVING ENVIRONMENT: Lives with: lives with their spouse Lives in: House/apartment Stairs: Yes: External: 4 steps; on left going up Has following equipment at home: None  PLOF: Independent and Independent with basic ADLs  PATIENT GOALS: improve balance. Reduce fall risk. Per Wife: improve Arm stretch    OBJECTIVE:   DIAGNOSTIC FINDINGS: EXAM: MRI HEAD WITHOUT CONTRAST  IMPRESSION: No evidence of acute intracranial abnormality.   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   Mild generalized cerebral and cerebellar atrophy.   Mild paranasal sinus disease, as described.   Trace fluid within the bilateral mastoid air cells.    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   EDEMA:  WFL  MUSCLE TONE: WFL   MUSCLE LENGTH: WFL  DTRs:  Patella 2+ = Normal  POSTURE: rounded shoulders, forward head, and flexed trunk   LOWER EXTREMITY ROM:     Active  Right Eval Left Eval   Hip flexion [facility]  Hip extension    Hip abduction [facility]  Hip adduction [facility]  Knee flexion [facility] WFL  Knee extension WFL Lacking 5 deg full extnesion   Ankle dorsiflexion WFL WFL   (Blank rows = not tested)  LOWER EXTREMITY MMT:    MMT Right Eval Left Eval  Hip flexion 4+ 4+  Hip extension    Hip abduction 4+ 4+  Hip adduction 5 5  Knee flexion 4+ 4+  Knee extension 5 5  Ankle dorsiflexion 4+ 4+  (Blank rows = not tested)  BED MOBILITY:  Sit to supine Complete Independence Supine to sit Complete Independence  TRANSFERS: Assistive device utilized: None  Sit to stand: Complete Independence Stand to sit: Complete Independence Chair to chair: Complete Independence Floor:  to be assessed   RAMP:  Level of Assistance: CGA Assistive device utilized:  none   CURB:  Level of Assistance: CGA Assistive device utilized: None   STAIRS: Level of Assistance: SBA Stair Negotiation Technique: Step to Pattern with Bilateral Rails Number of Stairs: 4  Height of Stairs: 6  Comments:  1 rail with step to ascent, 2 rails with step through descent   GAIT: Gait pattern: step through pattern, decreased arm swing- Right, decreased arm swing- Left, Left foot flat, shuffling, and narrow BOS Distance walked: 1257 in 6 min walk  Assistive device utilized: None Level of assistance: Complete Independence Comments: increased shuffle/festination as well as tremor in LUE/LLE with fatigue   FUNCTIONAL TESTS:  5 times sit to stand: 16.47sec  Timed up and go (TUG): 13.37sec  6 minute walk test: 1257' with no AD 10 meter walk test: 10.67sec(0.43m/s)  Berg Balance Scale: 47 Dynamic Gait Index: 19 Functional gait assessment: 21  PATIENT SURVEYS:  FOTO 55  TODAY'S TREATMENT:  DATE: 05/06/2023    Pt has no complaints of orthostatic hypotension on this day.    TherEx  Aerobic priming on octane fitness trainer x 6 min at level 2 as around 35 SPM   Neuro RE-ed:  Seated PWR! Moves x 10 ea of UP, rock, twist , and step  -most difficulty with twist, tactile cues for trunk rotation.   Standing with UE modified PWR! Up and PWR! Step x 10 ea   3/4 romberg on airex x 45 sec ea LE   Step to airex pad; cross body punch to punching bag, difficulty with sequencing of movement requiring tactile and verbal cues for proper completion.    STS with PWR! Up mod x 10 reps   Throughout session, PT provided CGA for safety to reduce fall risk and verbal and tactile cues for improved trunkal rotation and UE swing as appropriate.   PATIENT EDUCATION: Education details: Pt educated throughout session about proper posture and technique with exercises. Improved exercise technique, movement at target joints, use of target muscles after min to mod verbal, visual, tactile cues. POC, Therapy orientation, rehab goals and expectations  Person educated: Patient and Spouse Education method: Explanation and Demonstration Education comprehension: verbalized understanding  HOME EXERCISE PROGRAM:  Access Code: [email or web address] URL: [email or web address] Date: 04/20/2023 Prepared by: Grier Rocher  Exercises - Standing Hip Extension with Resistance at Ankles and Counter Support  - 1 x daily - 5 x weekly - 3 sets - 10 reps - Standing Hip Abduction with Resistance at Ankles and Counter Support  - 1 x daily - 5 x weekly - 3 sets - 10 reps - Marching with Resistance  - 1 x daily - 5 x weekly - 3 sets - 10 reps   SHORT TERM GOALS: Target date: 05/19/2023    Patient will be independent in home exercise program to improve strength/mobility for better functional independence with ADLs. Baseline: to be given at next session  Goal status: INITIAL   LONG TERM GOALS: Target date: 07/07/2023    Patient will increase FOTO score to equal to or greater than  59  to  demonstrate statistically significant improvement in mobility and quality of life.  Baseline: 55 Goal status: INITIAL  2.  Patient (> 110 years old) will complete five times sit to stand test in < 15 seconds indicating an increased LE strength and improved balance. Baseline: 16.47 sec  Goal status: INITIAL  3.  Patient will increase Berg Balance score by > 6 points to demonstrate decreased fall risk during functional activities Baseline: 47/56 Goal status: INITIAL  4.  Patient will increase 10 meter walk test to >1.94m/s as to improve gait speed for better community ambulation and to reduce fall risk. Baseline: 0.37m/s Goal status: INITIAL  5.  Patient will reduce timed up and go to <11 seconds to reduce fall risk and demonstrate improved transfer/gait ability. Baseline: 13.37 Goal status: INITIAL  6.  Patient will increase dynamic gait index score to >19/24 as to demonstrate reduced fall risk and improved dynamic gait balance for better safety with community/home ambulation.   Baseline: 19/24 Goal status: INITIAL  ASSESSMENT:  CLINICAL IMPRESSION:  Pt presents with great motivation for completion of PT activities. Pt introduced to PWR! Exercises and has difficulty with UE and LE coordinated movement patterns and with trunk rotation. Pt will benefit from skilled PT to improve balance, reduce fall risk, improve community access.   OBJECTIVE IMPAIRMENTS: Abnormal gait, cardiopulmonary status limiting activity, decreased activity tolerance, decreased  balance, decreased cognition, decreased coordination, decreased endurance, decreased knowledge of condition, decreased mobility, difficulty walking, decreased ROM, decreased strength, decreased safety awareness, impaired flexibility, impaired UE functional use, and improper body mechanics.   ACTIVITY LIMITATIONS: carrying, standing, and locomotion level  PARTICIPATION LIMITATIONS: meal prep, cleaning, laundry, driving, community activity,  yard work, and school  PERSONAL FACTORS: Age, Behavior pattern, Education, Fitness, and Time since onset of injury/illness/exacerbation are also affecting patient's functional outcome.   REHAB POTENTIAL: Good  CLINICAL DECISION MAKING: Stable/uncomplicated  EVALUATION COMPLEXITY: Moderate  PLAN:  PT FREQUENCY: 1-2x/week  PT DURATION: 12 weeks  PLANNED INTERVENTIONS: Therapeutic exercises, Therapeutic activity, Neuromuscular re-education, Balance training, Gait training, Patient/Family education, Self Care, Joint mobilization, Stair training, DME instructions, Cognitive remediation, Cryotherapy, Moist heat, Manual therapy, and Re-evaluation  PLAN FOR NEXT SESSION:   Weighted gait training.  Large amplitude movement.   Norman Herrlich PT ,DPT Physical Therapist- [facility]     11:51 AM 05/06/23

## 2023-05-11 ENCOUNTER — Ambulatory Visit: Payer: Medicare Other

## 2023-05-11 DIAGNOSIS — M6281 Muscle weakness (generalized): Secondary | ICD-10-CM | POA: Diagnosis not present

## 2023-05-11 DIAGNOSIS — R278 Other lack of coordination: Secondary | ICD-10-CM

## 2023-05-11 DIAGNOSIS — R2689 Other abnormalities of gait and mobility: Secondary | ICD-10-CM

## 2023-05-11 NOTE — Therapy (Signed)
OUTPATIENT PHYSICAL THERAPY NEURO TREATMENT   Patient Name: Carlos Morrison MRN: [id number] DOB:1941-09-04, 82 y.o., male Today's Date: 05/11/2023   PCP: Barbette Reichmann, MD  REFERRING PROVIDER: Barbette Reichmann, MD   END OF SESSION:  PT End of Session - 05/11/23 1408     Visit Number 8    Number of Visits 24    Date for PT Re-Evaluation 07/07/23    Progress Note Due on Visit 10    PT Start Time 1402    PT Stop Time 1444    PT Time Calculation (min) 42 min    Equipment Utilized During Treatment Gait belt    Activity Tolerance Patient tolerated treatment well;No increased pain    Behavior During Therapy WFL for tasks assessed/performed                Past Medical History:  Diagnosis Date   Arthritis    Bilateral knees   GERD (gastroesophageal reflux disease)    Hyperlipidemia    Past Surgical History:  Procedure Laterality Date   CATARACT EXTRACTION W/PHACO Left 03/30/2018   Procedure: CATARACT EXTRACTION PHACO AND INTRAOCULAR LENS PLACEMENT (IOC)  LEFT;  Surgeon: Lockie Mola, MD;  Location: [facility];  Service: Ophthalmology;  Laterality: Left;   CATARACT EXTRACTION W/PHACO Right 01/29/2021   Procedure: CATARACT EXTRACTION PHACO AND INTRAOCULAR LENS PLACEMENT (IOC) RIGHT;  Surgeon: Lockie Mola, MD;  Location: [facility];  Service: Ophthalmology;  Laterality: Right;  3.24 1:03.0 5.18%   COLONOSCOPY  2011   Dr. Kizzie Ide   COLONOSCOPY N/A 05/01/2015   Procedure: COLONOSCOPY;  Surgeon: Earline Mayotte, MD;  Location: [facility];  Service: Endoscopy;  Laterality: N/A;   GANGLION CYST EXCISION Left 08/2006   Patient Active Problem List   Diagnosis Date Noted   CN (constipation) 03/19/2015    ONSET DATE: fall 2 weeks ago.    REFERRING DIAG:  R26.89 (ICD-10-CM) - Other abnormalities of gait and mobility  Z91.81 (ICD-10-CM) - History of falling    THERAPY DIAG:  Muscle weakness (generalized)  Other abnormalities of  gait and mobility  Other lack of coordination  Rationale for Evaluation and Treatment: Rehabilitation  SUBJECTIVE:                                                                                                                                                                                             SUBJECTIVE STATEMENT:  Patient reports feeling tired today and wife requesting to check blood pressure.      Pt accompanied by: significant other [name] Duer   PERTINENT HISTORY: recent fall.   PAIN:  Are you  having pain? No  PRECAUTIONS: None  WEIGHT BEARING RESTRICTIONS: No  FALLS: Has patient fallen in last 6 months? Yes. Number of falls 1  LIVING ENVIRONMENT: Lives with: lives with their spouse Lives in: House/apartment Stairs: Yes: External: 4 steps; on left going up Has following equipment at home: None  PLOF: Independent and Independent with basic ADLs  PATIENT GOALS: improve balance. Reduce fall risk. Per Wife: improve Arm stretch    OBJECTIVE:   DIAGNOSTIC FINDINGS: EXAM: MRI HEAD WITHOUT CONTRAST  IMPRESSION: No evidence of acute intracranial abnormality.   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   Mild generalized cerebral and cerebellar atrophy.   Mild paranasal sinus disease, as described.   Trace fluid within the bilateral mastoid air cells.    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   EDEMA:  WFL  MUSCLE TONE: WFL   MUSCLE LENGTH: WFL  DTRs:  Patella 2+ = Normal  POSTURE: rounded shoulders, forward head, and flexed trunk   LOWER EXTREMITY ROM:     Active  Right Eval Left Eval  Hip flexion [facility]  Hip extension    Hip abduction [facility]  Hip adduction [facility]  Knee flexion [facility] WFL  Knee extension WFL Lacking 5  deg full extnesion   Ankle dorsiflexion WFL WFL   (Blank rows = not tested)  LOWER EXTREMITY MMT:    MMT Right Eval Left Eval  Hip flexion 4+ 4+  Hip extension    Hip abduction 4+ 4+  Hip adduction 5 5  Knee flexion 4+ 4+  Knee extension 5 5  Ankle dorsiflexion 4+ 4+  (Blank rows = not tested)  BED MOBILITY:  Sit to supine Complete Independence Supine to sit Complete Independence  TRANSFERS: Assistive device utilized: None  Sit to stand: Complete Independence Stand to sit: Complete Independence Chair to chair: Complete Independence Floor:  to be assessed   RAMP:  Level of Assistance: CGA Assistive device utilized:  none   CURB:  Level of Assistance: CGA Assistive device utilized: None   STAIRS: Level of Assistance: SBA Stair Negotiation Technique: Step to Pattern with Bilateral Rails Number of Stairs: 4  Height of Stairs: 6  Comments:  1 rail with step to ascent, 2 rails with step through descent   GAIT: Gait pattern: step through pattern, decreased arm swing- Right, decreased arm swing- Left, Left foot flat, shuffling, and narrow BOS Distance walked: 1257 in 6 min walk  Assistive device utilized: None Level of assistance: Complete Independence Comments: increased shuffle/festination as well as tremor in LUE/LLE with fatigue   FUNCTIONAL TESTS:  5 times sit to stand: 16.47sec  Timed up and go (TUG): 13.37sec  6 minute walk test: 1257' with no AD 10 meter walk test: 10.67sec(0.57m/s)  Berg Balance Scale: 47 Dynamic Gait Index: 19 Functional gait assessment: 21  PATIENT SURVEYS:  FOTO 55  TODAY'S TREATMENT:  DATE: 05/11/2023    Pt has no complaints of orthostatic hypotension on this day but did endorse not feeling great.  BP= 107/59 mmHg Sitting BP= 103/57 mmHg standing  TherEx  Seated step tap onto 6" block for warm -up 25  reps (counted every right LE) in 30 sec  Sit to stand from bench seat with UE Overhead raise x 10 reps x 2 sets   Seated Lean forward then ext with arms stretched out to side x 10 reps Seated lateral trunk lean with arms out- Reach for ceiling while opp arm reaching toward floor x 10 reps Seated with legs out wide and side to side trunk rotation with hands clapping upon turning 10 reps Seated trunk forward lean then move LE to turn to side of chair then back x 10 reps.   Standing with forward squat position with arms out into standing ext with shoulders ext-  x 10 ea  Standing with Lateral weight shift and 1 UE reaching toward ceiling and opp UE toward floor x 10 reps each  Standing thoracic trunk rotation with arms outstretched x 10 reps ea direction.  Side step and reach with arms outstretched x 10 reps each   Standing - side step and diagonally reaching forward and tap wall to right then back to left x 12 reps each.   Static wall posture stretch- hold 60 sec x 2 sets.       Throughout session, PT provided CGA for safety to reduce fall risk and verbal and tactile cues for improved trunkal rotation and UE swing as appropriate.   PATIENT EDUCATION: Education details: Pt educated throughout session about proper posture and technique with exercises. Improved exercise technique, movement at target joints, use of target muscles after min to mod verbal, visual, tactile cues. POC, Therapy orientation, rehab goals and expectations  Person educated: Patient and Spouse Education method: Explanation and Demonstration Education comprehension: verbalized understanding  HOME EXERCISE PROGRAM:  Access Code: [email or web address] URL: [email or web address] Date: 04/20/2023 Prepared by: Grier Rocher  Exercises - Standing Hip Extension with Resistance at Ankles and Counter Support  - 1 x daily - 5 x weekly - 3 sets - 10 reps - Standing Hip Abduction with Resistance at Ankles and Counter  Support  - 1 x daily - 5 x weekly - 3 sets - 10 reps - Marching with Resistance  - 1 x daily - 5 x weekly - 3 sets - 10 reps   SHORT TERM GOALS: Target date: 05/19/2023    Patient will be independent in home exercise program to improve strength/mobility for better functional independence with ADLs. Baseline: to be given at next session  Goal status: INITIAL   LONG TERM GOALS: Target date: 07/07/2023    Patient will increase FOTO score to equal to or greater than  59  to demonstrate statistically significant improvement in mobility and quality of life.  Baseline: 55 Goal status: INITIAL  2.  Patient (> 94 years old) will complete five times sit to stand test in < 15 seconds indicating an increased LE strength and improved balance. Baseline: 16.47 sec  Goal status: INITIAL  3.  Patient will increase Berg Balance score by > 6 points to demonstrate decreased fall risk during functional activities Baseline: 47/56 Goal status: INITIAL  4.  Patient will increase 10 meter walk test to >1.4m/s as to improve gait speed for better community ambulation and to reduce fall risk. Baseline: 0.61m/s Goal status: INITIAL  5.  Patient will reduce timed  up and go to <11 seconds to reduce fall risk and demonstrate improved transfer/gait ability. Baseline: 13.37 Goal status: INITIAL  6.  Patient will increase dynamic gait index score to >19/24 as to demonstrate reduced fall risk and improved dynamic gait balance for better safety with community/home ambulation.   Baseline: 19/24 Goal status: INITIAL  ASSESSMENT:  CLINICAL IMPRESSION:  Pt arrived to clinic reported feeling tired. Yet once he started performing therex- he performed well overall and able to complete all therex today with only brief rest breaks. He required consistent VC and tactile cues to turn head or open arms wide but did improve with practice. He denied any pain or difficulty with today's activities. Pt will benefit from skilled  PT to improve balance, reduce fall risk, improve community access.   OBJECTIVE IMPAIRMENTS: Abnormal gait, cardiopulmonary status limiting activity, decreased activity tolerance, decreased balance, decreased cognition, decreased coordination, decreased endurance, decreased knowledge of condition, decreased mobility, difficulty walking, decreased ROM, decreased strength, decreased safety awareness, impaired flexibility, impaired UE functional use, and improper body mechanics.   ACTIVITY LIMITATIONS: carrying, standing, and locomotion level  PARTICIPATION LIMITATIONS: meal prep, cleaning, laundry, driving, community activity, yard work, and school  PERSONAL FACTORS: Age, Behavior pattern, Education, Fitness, and Time since onset of injury/illness/exacerbation are also affecting patient's functional outcome.   REHAB POTENTIAL: Good  CLINICAL DECISION MAKING: Stable/uncomplicated  EVALUATION COMPLEXITY: Moderate  PLAN:  PT FREQUENCY: 1-2x/week  PT DURATION: 12 weeks  PLANNED INTERVENTIONS: Therapeutic exercises, Therapeutic activity, Neuromuscular re-education, Balance training, Gait training, Patient/Family education, Self Care, Joint mobilization, Stair training, DME instructions, Cognitive remediation, Cryotherapy, Moist heat, Manual therapy, and Re-evaluation  PLAN FOR NEXT SESSION:   Weighted gait training.  Large amplitude movement.   Lenda Kelp PT  Physical Therapist- [place]  [facility]     3:53 PM 05/11/23

## 2023-05-13 ENCOUNTER — Ambulatory Visit: Payer: Medicare Other | Attending: Internal Medicine

## 2023-05-13 DIAGNOSIS — M6281 Muscle weakness (generalized): Secondary | ICD-10-CM | POA: Diagnosis present

## 2023-05-13 DIAGNOSIS — R2689 Other abnormalities of gait and mobility: Secondary | ICD-10-CM | POA: Insufficient documentation

## 2023-05-13 DIAGNOSIS — R278 Other lack of coordination: Secondary | ICD-10-CM | POA: Insufficient documentation

## 2023-05-13 NOTE — Therapy (Signed)
OUTPATIENT PHYSICAL THERAPY NEURO TREATMENT   Patient Name: Carlos Morrison MRN: [id number] DOB:1941-04-19, 82 y.o., male Today's Date: 05/13/2023   PCP: Barbette Reichmann, MD  REFERRING PROVIDER: Barbette Reichmann, MD   END OF SESSION:  PT End of Session - 05/13/23 1609     Visit Number 9    Number of Visits 24    Date for PT Re-Evaluation 07/07/23    Progress Note Due on Visit 10    PT Start Time 1610    Equipment Utilized During Treatment Gait belt    Activity Tolerance Patient tolerated treatment well;No increased pain    Behavior During Therapy WFL for tasks assessed/performed                Past Medical History:  Diagnosis Date   Arthritis    Bilateral knees   GERD (gastroesophageal reflux disease)    Hyperlipidemia    Past Surgical History:  Procedure Laterality Date   CATARACT EXTRACTION W/PHACO Left 03/30/2018   Procedure: CATARACT EXTRACTION PHACO AND INTRAOCULAR LENS PLACEMENT (IOC)  LEFT;  Surgeon: Lockie Mola, MD;  Location: [facility];  Service: Ophthalmology;  Laterality: Left;   CATARACT EXTRACTION W/PHACO Right 01/29/2021   Procedure: CATARACT EXTRACTION PHACO AND INTRAOCULAR LENS PLACEMENT (IOC) RIGHT;  Surgeon: Lockie Mola, MD;  Location: [facility];  Service: Ophthalmology;  Laterality: Right;  3.24 1:03.0 5.18%   COLONOSCOPY  2011   Dr. Kizzie Ide   COLONOSCOPY N/A 05/01/2015   Procedure: COLONOSCOPY;  Surgeon: Earline Mayotte, MD;  Location: [facility] END[facility];  Service: Endoscopy;  Laterality: N/A;   GANGLION CYST EXCISION Left 08/2006   Patient Active Problem List   Diagnosis Date Noted   CN (constipation) 03/19/2015    ONSET DATE: fall 2 weeks ago.    REFERRING DIAG:  R26.89 (ICD-10-CM) - Other abnormalities of gait and mobility  Z91.81 (ICD-10-CM) - History of falling    THERAPY DIAG:  Muscle weakness (generalized)  Other abnormalities of gait and mobility  Other lack of coordination  Rationale  for Evaluation and Treatment: Rehabilitation  SUBJECTIVE:                                                                                                                                                                                             SUBJECTIVE STATEMENT:  Patient reports feeling better than he did last visit. No soreness from last visit. He denies any falls or pain.    Pt accompanied by: significant other [name] Sallade   PERTINENT HISTORY: recent fall.   PAIN:  Are you having pain? No  PRECAUTIONS: None  WEIGHT BEARING RESTRICTIONS:  No  FALLS: Has patient fallen in last 6 months? Yes. Number of falls 1  LIVING ENVIRONMENT: Lives with: lives with their spouse Lives in: House/apartment Stairs: Yes: External: 4 steps; on left going up Has following equipment at home: None  PLOF: Independent and Independent with basic ADLs  PATIENT GOALS: improve balance. Reduce fall risk. Per Wife: improve Arm stretch    OBJECTIVE:   DIAGNOSTIC FINDINGS: EXAM: MRI HEAD WITHOUT CONTRAST  IMPRESSION: No evidence of acute intracranial abnormality.   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   Mild generalized cerebral and cerebellar atrophy.   Mild paranasal sinus disease, as described.   Trace fluid within the bilateral mastoid air cells.    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   EDEMA:  WFL  MUSCLE TONE: WFL   MUSCLE LENGTH: WFL  DTRs:  Patella 2+ = Normal  POSTURE: rounded shoulders, forward head, and flexed trunk   LOWER EXTREMITY ROM:     Active  Right Eval Left Eval  Hip flexion [facility]  Hip extension    Hip abduction [facility]  Hip adduction [facility]  Knee flexion [facility] WFL  Knee extension WFL Lacking 5 deg full extnesion   Ankle  dorsiflexion WFL WFL   (Blank rows = not tested)  LOWER EXTREMITY MMT:    MMT Right Eval Left Eval  Hip flexion 4+ 4+  Hip extension    Hip abduction 4+ 4+  Hip adduction 5 5  Knee flexion 4+ 4+  Knee extension 5 5  Ankle dorsiflexion 4+ 4+  (Blank rows = not tested)  BED MOBILITY:  Sit to supine Complete Independence Supine to sit Complete Independence  TRANSFERS: Assistive device utilized: None  Sit to stand: Complete Independence Stand to sit: Complete Independence Chair to chair: Complete Independence Floor:  to be assessed   RAMP:  Level of Assistance: CGA Assistive device utilized:  none   CURB:  Level of Assistance: CGA Assistive device utilized: None   STAIRS: Level of Assistance: SBA Stair Negotiation Technique: Step to Pattern with Bilateral Rails Number of Stairs: 4  Height of Stairs: 6  Comments:  1 rail with step to ascent, 2 rails with step through descent   GAIT: Gait pattern: step through pattern, decreased arm swing- Right, decreased arm swing- Left, Left foot flat, shuffling, and narrow BOS Distance walked: 1257 in 6 min walk  Assistive device utilized: None Level of assistance: Complete Independence Comments: increased shuffle/festination as well as tremor in LUE/LLE with fatigue   FUNCTIONAL TESTS:  5 times sit to stand: 16.47sec  Timed up and go (TUG): 13.37sec  6 minute walk test: 1257' with no AD 10 meter walk test: 10.67sec(0.36m/s)  Berg Balance Scale: 47 Dynamic Gait Index: 19 Functional gait assessment: 21  PATIENT SURVEYS:  FOTO 55  TODAY'S TREATMENT:  DATE: 05/13/2023     TherEx:    Sit to stand from chair with UE Overhead raise and calf raise x 10 reps   Standing thoracic trunk rotation with arms outstretched x 10 reps ea direction.  Side step and reach with arms outstretched x 10 reps each    NMR:   High knee march without UE support x 30 reps each LE (VC to slow down for more focus on balance)  Standing at dry erase board while performing the following dual task Feet narrowed on firm surface - placing letters in alpha order Tandem standing on foam - placing letters in alpha order  Resistive gait  with 3# AW with horizontal head turns x 80 feet x 2 (with 100 % accuracy on 2nd lap)   Resistive gait with 3# AW focusing on reciprocal steps  Side step up/over 1/2 foam with 3# AW x 20 reps       Throughout session, PT provided CGA for safety to reduce fall risk and verbal and tactile cues for improved trunkal rotation and UE swing as appropriate.   PATIENT EDUCATION: Education details: Pt educated throughout session about proper posture and technique with exercises. Improved exercise technique, movement at target joints, use of target muscles after min to mod verbal, visual, tactile cues. POC, Therapy orientation, rehab goals and expectations  Person educated: Patient and Spouse Education method: Explanation and Demonstration Education comprehension: verbalized understanding  HOME EXERCISE PROGRAM:  Access Code: [email or web address] URL: [email or web address] Date: 04/20/2023 Prepared by: Grier Rocher  Exercises - Standing Hip Extension with Resistance at Ankles and Counter Support  - 1 x daily - 5 x weekly - 3 sets - 10 reps - Standing Hip Abduction with Resistance at Ankles and Counter Support  - 1 x daily - 5 x weekly - 3 sets - 10 reps - Marching with Resistance  - 1 x daily - 5 x weekly - 3 sets - 10 reps   SHORT TERM GOALS: Target date: 05/19/2023    Patient will be independent in home exercise program to improve strength/mobility for better functional independence with ADLs. Baseline: to be given at next session  Goal status: INITIAL   LONG TERM GOALS: Target date: 07/07/2023    Patient will increase FOTO score to equal to or greater than  59   to demonstrate statistically significant improvement in mobility and quality of life.  Baseline: 55 Goal status: INITIAL  2.  Patient (> 77 years old) will complete five times sit to stand test in < 15 seconds indicating an increased LE strength and improved balance. Baseline: 16.47 sec  Goal status: INITIAL  3.  Patient will increase Berg Balance score by > 6 points to demonstrate decreased fall risk during functional activities Baseline: 47/56 Goal status: INITIAL  4.  Patient will increase 10 meter walk test to >1.41m/s as to improve gait speed for better community ambulation and to reduce fall risk. Baseline: 0.31m/s Goal status: INITIAL  5.  Patient will reduce timed up and go to <11 seconds to reduce fall risk and demonstrate improved transfer/gait ability. Baseline: 13.37 Goal status: INITIAL  6.  Patient will increase dynamic gait index score to >19/24 as to demonstrate reduced fall risk and improved dynamic gait balance for better safety with community/home ambulation.   Baseline: 19/24 Goal status: INITIAL  ASSESSMENT:  CLINICAL IMPRESSION:  Treatment continued to focus on LE strengthening and balance. Patient responded well overall to treatment - challenged with dual task - tandem standing  yet performed very well with resistive gait - no evidence of shuffling with ankle weights today.  Pt will benefit from skilled PT to improve balance, reduce fall risk, improve community access.   OBJECTIVE IMPAIRMENTS: Abnormal gait, cardiopulmonary status limiting activity, decreased activity tolerance, decreased balance, decreased cognition, decreased coordination, decreased endurance, decreased knowledge of condition, decreased mobility, difficulty walking, decreased ROM, decreased strength, decreased safety awareness, impaired flexibility, impaired UE functional use, and improper body mechanics.   ACTIVITY LIMITATIONS: carrying, standing, and locomotion level  PARTICIPATION  LIMITATIONS: meal prep, cleaning, laundry, driving, community activity, yard work, and school  PERSONAL FACTORS: Age, Behavior pattern, Education, Fitness, and Time since onset of injury/illness/exacerbation are also affecting patient's functional outcome.   REHAB POTENTIAL: Good  CLINICAL DECISION MAKING: Stable/uncomplicated  EVALUATION COMPLEXITY: Moderate  PLAN:  PT FREQUENCY: 1-2x/week  PT DURATION: 12 weeks  PLANNED INTERVENTIONS: Therapeutic exercises, Therapeutic activity, Neuromuscular re-education, Balance training, Gait training, Patient/Family education, Self Care, Joint mobilization, Stair training, DME instructions, Cognitive remediation, Cryotherapy, Moist heat, Manual therapy, and Re-evaluation  PLAN FOR NEXT SESSION:   Weighted gait training.  Large amplitude movement.   Lenda Kelp PT  Physical Therapist- [place]  [facility]     4:33 PM 05/13/23

## 2023-05-18 ENCOUNTER — Ambulatory Visit: Payer: Federal, State, Local not specified - PPO | Admitting: Physical Therapy

## 2023-05-20 ENCOUNTER — Ambulatory Visit: Payer: Federal, State, Local not specified - PPO

## 2023-05-24 ENCOUNTER — Ambulatory Visit: Payer: Federal, State, Local not specified - PPO | Admitting: Physical Therapy

## 2023-05-25 ENCOUNTER — Ambulatory Visit: Payer: Medicare Other | Admitting: Physical Therapy

## 2023-05-25 DIAGNOSIS — M6281 Muscle weakness (generalized): Secondary | ICD-10-CM | POA: Diagnosis not present

## 2023-05-25 DIAGNOSIS — R2689 Other abnormalities of gait and mobility: Secondary | ICD-10-CM

## 2023-05-25 DIAGNOSIS — R278 Other lack of coordination: Secondary | ICD-10-CM

## 2023-05-25 NOTE — Therapy (Unsigned)
OUTPATIENT PHYSICAL THERAPY NEURO TREATMENT/  PHYSICAL THERAPY PROGRESS NOTE   Dates of reporting period  04/14/2023  to 05/25/2023     Patient Name: Carlos Morrison MRN: [id number] DOB:31-Jan-1941, 82 y.o., male Today's Date: 05/25/2023   PCP: Barbette Reichmann, MD  REFERRING PROVIDER: Barbette Reichmann, MD   END OF SESSION:  PT End of Session - 05/25/23 1623     Visit Number 10    Number of Visits 24    Date for PT Re-Evaluation 07/07/23    Progress Note Due on Visit 10    PT Start Time 1619    PT Stop Time 1700    PT Time Calculation (min) 41 min    Equipment Utilized During Treatment Gait belt    Activity Tolerance Patient tolerated treatment well;No increased pain    Behavior During Therapy WFL for tasks assessed/performed                Past Medical History:  Diagnosis Date   Arthritis    Bilateral knees   GERD (gastroesophageal reflux disease)    Hyperlipidemia    Past Surgical History:  Procedure Laterality Date   CATARACT EXTRACTION W/PHACO Left 03/30/2018   Procedure: CATARACT EXTRACTION PHACO AND INTRAOCULAR LENS PLACEMENT (IOC)  LEFT;  Surgeon: Lockie Mola, MD;  Location: [facility];  Service: Ophthalmology;  Laterality: Left;   CATARACT EXTRACTION W/PHACO Right 01/29/2021   Procedure: CATARACT EXTRACTION PHACO AND INTRAOCULAR LENS PLACEMENT (IOC) RIGHT;  Surgeon: Lockie Mola, MD;  Location: [facility];  Service: Ophthalmology;  Laterality: Right;  3.24 1:03.0 5.18%   COLONOSCOPY  2011   Dr. Kizzie Ide   COLONOSCOPY N/A 05/01/2015   Procedure: COLONOSCOPY;  Surgeon: Earline Mayotte, MD;  Location: [facility] END[facility];  Service: Endoscopy;  Laterality: N/A;   GANGLION CYST EXCISION Left 08/2006   Patient Active Problem List   Diagnosis Date Noted   CN (constipation) 03/19/2015    ONSET DATE: fall 2 weeks ago.    REFERRING DIAG:  R26.89 (ICD-10-CM) - Other abnormalities of gait and mobility  Z91.81 (ICD-10-CM) -  History of falling    THERAPY DIAG:  Muscle weakness (generalized)  Other abnormalities of gait and mobility  Other lack of coordination  Rationale for Evaluation and Treatment: Rehabilitation  SUBJECTIVE:                                                                                                                                                                                             SUBJECTIVE STATEMENT:  Patient reports feeling well. No falls or LOB since last session. Mild knee pain in the L side  due to arthritis.    Pt accompanied by: significant other [name] Gazzola   PERTINENT HISTORY: recent fall.   PAIN:  Are you having pain? No  PRECAUTIONS: None  WEIGHT BEARING RESTRICTIONS: No  FALLS: Has patient fallen in last 6 months? Yes. Number of falls 1  LIVING ENVIRONMENT: Lives with: lives with their spouse Lives in: House/apartment Stairs: Yes: External: 4 steps; on left going up Has following equipment at home: None  PLOF: Independent and Independent with basic ADLs  PATIENT GOALS: improve balance. Reduce fall risk. Per Wife: improve Arm stretch    OBJECTIVE:   DIAGNOSTIC FINDINGS: EXAM: MRI HEAD WITHOUT CONTRAST  IMPRESSION: No evidence of acute intracranial abnormality.   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   Mild generalized cerebral and cerebellar atrophy.   Mild paranasal sinus disease, as described.   Trace fluid within the bilateral mastoid air cells.    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   EDEMA:  WFL  MUSCLE TONE: WFL   MUSCLE LENGTH: WFL  DTRs:  Patella 2+ = Normal  POSTURE: rounded shoulders, forward head, and flexed trunk   LOWER EXTREMITY ROM:     Active  Right Eval Left Eval  Hip flexion [facility]  [facility]  Hip extension    Hip abduction [facility]  Hip adduction [facility]  Knee flexion [facility] WFL  Knee extension WFL Lacking 5 deg full extnesion   Ankle dorsiflexion WFL WFL   (Blank rows = not tested)  LOWER EXTREMITY MMT:    MMT Right Eval Left Eval  Hip flexion 4+ 4+  Hip extension    Hip abduction 4+ 4+  Hip adduction 5 5  Knee flexion 4+ 4+  Knee extension 5 5  Ankle dorsiflexion 4+ 4+  (Blank rows = not tested)  BED MOBILITY:  Sit to supine Complete Independence Supine to sit Complete Independence  TRANSFERS: Assistive device utilized: None  Sit to stand: Complete Independence Stand to sit: Complete Independence Chair to chair: Complete Independence Floor:  to be assessed   RAMP:  Level of Assistance: CGA Assistive device utilized:  none   CURB:  Level of Assistance: CGA Assistive device utilized: None   STAIRS: Level of Assistance: SBA Stair Negotiation Technique: Step to Pattern with Bilateral Rails Number of Stairs: 4  Height of Stairs: 6  Comments:  1 rail with step to ascent, 2 rails with step through descent   GAIT: Gait pattern: step through pattern, decreased arm swing- Right, decreased arm swing- Left, Left foot flat, shuffling, and narrow BOS Distance walked: 1257 in 6 min walk  Assistive device utilized: None Level of assistance: Complete Independence Comments: increased shuffle/festination as well as tremor in LUE/LLE with fatigue   FUNCTIONAL TESTS:  5 times sit to stand: 16.47sec  Timed up and go (TUG): 13.37sec  6 minute walk test: 1257' with no AD 10 meter walk test: 10.67sec(0.70m/s)  Berg Balance Scale: 47 Dynamic Gait Index: 19 Functional gait assessment: 21  PATIENT SURVEYS:  FOTO 55  TODAY'S TREATMENT:  DATE: 05/25/2023    PT instructed pt in progress note assessment to measure progress towards LTGs.    Throughout session, PT provided CGA for safety to reduce fall risk and verbal and tactile cues for improved trunkal rotation and UE swing as appropriate.   PATIENT EDUCATION: Education details: Pt educated throughout session about proper posture and technique with exercises. Improved exercise technique, movement at target joints, use of target muscles after min to mod verbal, visual, tactile cues. POC, Therapy orientation, rehab goals and expectations  Person educated: Patient and Spouse Education method: Explanation and Demonstration Education comprehension: verbalized understanding  HOME EXERCISE PROGRAM:  Access Code: [email or web address] URL: [email or web address] Date: 04/20/2023 Prepared by: Grier Rocher  Exercises - Standing Hip Extension with Resistance at Ankles and Counter Support  - 1 x daily - 5 x weekly - 3 sets - 10 reps - Standing Hip Abduction with Resistance at Ankles and Counter Support  - 1 x daily - 5 x weekly - 3 sets - 10 reps - Marching with Resistance  - 1 x daily - 5 x weekly - 3 sets - 10 reps   SHORT TERM GOALS: Target date: 07/07/2023    Patient will be independent in home exercise program to improve strength/mobility for better functional independence with ADLs. Baseline: given on 7/9 Goal status: IN PROGRESS   LONG TERM GOALS: Target date: 07/07/2023    Patient will increase FOTO score to equal to or greater than  59  to demonstrate statistically significant improvement in mobility and quality of life.  Baseline: 55 8/13: 48  Goal status: IN PROGRESS   2.  Patient (> 64 years old) will complete five times sit to stand test in < 15 seconds indicating an increased LE strength and improved balance. Baseline: 16.47 sec  8/13 15.07sec  Goal status: IN PROGRESS  3.  Patient will increase Berg Balance score by > 6 points to demonstrate decreased fall risk during functional activities Baseline: 47/56 8/13:50 Goal status: IN PROGRESS  4.   Patient will increase 10 meter walk test to >1.55m/s as to improve gait speed for better community ambulation and to reduce fall risk. Baseline: 0.38m/s  8/13: normal 0.56m/s. Fast: 1.25m/s Goal status: IN PROGRESS  5.  Patient will reduce timed up and go to <11 seconds to reduce fall risk and demonstrate improved transfer/gait ability. Baseline: 13.37 8/13: 11.42 sec  Goal status: IN PROGRESS  6.  Patient will increase dynamic gait index score to >19/24 as to demonstrate reduced fall risk and improved dynamic gait balance for better safety with community/home ambulation.   Baseline: 19/24 8/13: 21/24 Goal status: MET  ASSESSMENT:  CLINICAL IMPRESSION: Treatment continued to focus on LE strengthening and balance. Patient responded well overall to treatment - challenged with dual task - tandem standing yet performed very well with resistive gait - no evidence of shuffling with ankle weights today. Patient's condition has the potential to improve in response to therapy. Maximum improvement is yet to be obtained. The anticipated improvement is attainable and reasonable in a generally predictable time.   Pt will benefit from skilled PT to improve balance, reduce fall risk, improve community access.   OBJECTIVE IMPAIRMENTS: Abnormal gait, cardiopulmonary status limiting activity, decreased activity tolerance, decreased balance, decreased cognition, decreased coordination, decreased endurance, decreased knowledge of condition, decreased mobility, difficulty walking, decreased ROM, decreased strength, decreased safety awareness, impaired flexibility, impaired UE functional use, and improper body mechanics.   ACTIVITY LIMITATIONS: carrying, standing, and locomotion level  PARTICIPATION LIMITATIONS:  meal prep, cleaning, laundry, driving, community activity, yard work, and school  PERSONAL FACTORS: Age, Behavior pattern, Education, Fitness, and Time since onset of injury/illness/exacerbation are  also affecting patient's functional outcome.   REHAB POTENTIAL: Good  CLINICAL DECISION MAKING: Stable/uncomplicated  EVALUATION COMPLEXITY: Moderate  PLAN:  PT FREQUENCY: 1-2x/week  PT DURATION: 12 weeks  PLANNED INTERVENTIONS: Therapeutic exercises, Therapeutic activity, Neuromuscular re-education, Balance training, Gait training, Patient/Family education, Self Care, Joint mobilization, Stair training, DME instructions, Cognitive remediation, Cryotherapy, Moist heat, Manual therapy, and Re-evaluation  PLAN FOR NEXT SESSION:   Weighted gait training.  Large amplitude movement.   Golden Pop PT  Physical Therapist- [place]  [facility]     5:40 PM 05/25/23

## 2023-05-27 ENCOUNTER — Ambulatory Visit: Payer: Medicare Other | Admitting: Physical Therapy

## 2023-05-27 DIAGNOSIS — R278 Other lack of coordination: Secondary | ICD-10-CM

## 2023-05-27 DIAGNOSIS — M6281 Muscle weakness (generalized): Secondary | ICD-10-CM | POA: Diagnosis not present

## 2023-05-27 DIAGNOSIS — R2689 Other abnormalities of gait and mobility: Secondary | ICD-10-CM

## 2023-05-27 NOTE — Therapy (Signed)
OUTPATIENT PHYSICAL THERAPY NEURO TREATMENT/    Patient Name: Carlos Morrison MRN: [id number] DOB:10-18-1940, 82 y.o., male Today's Date: 05/27/2023   PCP: Barbette Reichmann, MD  REFERRING PROVIDER: Barbette Reichmann, MD   END OF SESSION:  PT End of Session - 05/27/23 1410     Visit Number 11    Number of Visits 24    Date for PT Re-Evaluation 07/07/23    Progress Note Due on Visit 20    PT Start Time 1403    PT Stop Time 1444    PT Time Calculation (min) 41 min    Equipment Utilized During Treatment Gait belt    Activity Tolerance Patient tolerated treatment well;No increased pain    Behavior During Therapy WFL for tasks assessed/performed                Past Medical History:  Diagnosis Date   Arthritis    Bilateral knees   GERD (gastroesophageal reflux disease)    Hyperlipidemia    Past Surgical History:  Procedure Laterality Date   CATARACT EXTRACTION W/PHACO Left 03/30/2018   Procedure: CATARACT EXTRACTION PHACO AND INTRAOCULAR LENS PLACEMENT (IOC)  LEFT;  Surgeon: Lockie Mola, MD;  Location: [facility];  Service: Ophthalmology;  Laterality: Left;   CATARACT EXTRACTION W/PHACO Right 01/29/2021   Procedure: CATARACT EXTRACTION PHACO AND INTRAOCULAR LENS PLACEMENT (IOC) RIGHT;  Surgeon: Lockie Mola, MD;  Location: [facility];  Service: Ophthalmology;  Laterality: Right;  3.24 1:03.0 5.18%   COLONOSCOPY  2011   Dr. Kizzie Ide   COLONOSCOPY N/A 05/01/2015   Procedure: COLONOSCOPY;  Surgeon: Earline Mayotte, MD;  Location: [facility] END[facility];  Service: Endoscopy;  Laterality: N/A;   GANGLION CYST EXCISION Left 08/2006   Patient Active Problem List   Diagnosis Date Noted   CN (constipation) 03/19/2015    ONSET DATE: fall 2 weeks ago.    REFERRING DIAG:  R26.89 (ICD-10-CM) - Other abnormalities of gait and mobility  Z91.81 (ICD-10-CM) - History of falling    THERAPY DIAG:  Muscle weakness (generalized)  Other abnormalities  of gait and mobility  Other lack of coordination  Rationale for Evaluation and Treatment: Rehabilitation  SUBJECTIVE:                                                                                                                                                                                             SUBJECTIVE STATEMENT:  Patient reports feeling well. No falls or LOB since last session. Mild knee pain in the L side due to arthritis.    Pt accompanied by: significant other [name] Beam   PERTINENT HISTORY:  recent fall.   PAIN:  Are you having pain? No  PRECAUTIONS: None  WEIGHT BEARING RESTRICTIONS: No  FALLS: Has patient fallen in last 6 months? Yes. Number of falls 1  LIVING ENVIRONMENT: Lives with: lives with their spouse Lives in: House/apartment Stairs: Yes: External: 4 steps; on left going up Has following equipment at home: None  PLOF: Independent and Independent with basic ADLs  PATIENT GOALS: improve balance. Reduce fall risk. Per Wife: improve Arm stretch    OBJECTIVE:   DIAGNOSTIC FINDINGS: EXAM: MRI HEAD WITHOUT CONTRAST  IMPRESSION: No evidence of acute intracranial abnormality.   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   Mild generalized cerebral and cerebellar atrophy.   Mild paranasal sinus disease, as described.   Trace fluid within the bilateral mastoid air cells.    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   EDEMA:  WFL  MUSCLE TONE: WFL   MUSCLE LENGTH: WFL  DTRs:  Patella 2+ = Normal  POSTURE: rounded shoulders, forward head, and flexed trunk   LOWER EXTREMITY ROM:     Active  Right Eval Left Eval  Hip flexion [facility]  Hip extension    Hip abduction [facility]  Hip adduction [facility]  Knee flexion [facility] WFL   Knee extension WFL Lacking 5 deg full extnesion   Ankle dorsiflexion WFL WFL   (Blank rows = not tested)  LOWER EXTREMITY MMT:    MMT Right Eval Left Eval  Hip flexion 4+ 4+  Hip extension    Hip abduction 4+ 4+  Hip adduction 5 5  Knee flexion 4+ 4+  Knee extension 5 5  Ankle dorsiflexion 4+ 4+  (Blank rows = not tested)  BED MOBILITY:  Sit to supine Complete Independence Supine to sit Complete Independence  TRANSFERS: Assistive device utilized: None  Sit to stand: Complete Independence Stand to sit: Complete Independence Chair to chair: Complete Independence Floor:  to be assessed   RAMP:  Level of Assistance: CGA Assistive device utilized:  none   CURB:  Level of Assistance: CGA Assistive device utilized: None   STAIRS: Level of Assistance: SBA Stair Negotiation Technique: Step to Pattern with Bilateral Rails Number of Stairs: 4  Height of Stairs: 6  Comments:  1 rail with step to ascent, 2 rails with step through descent   GAIT: Gait pattern: step through pattern, decreased arm swing- Right, decreased arm swing- Left, Left foot flat, shuffling, and narrow BOS Distance walked: 1257 in 6 min walk  Assistive device utilized: None Level of assistance: Complete Independence Comments: increased shuffle/festination as well as tremor in LUE/LLE with fatigue   FUNCTIONAL TESTS:  5 times sit to stand: 16.47sec  Timed up and go (TUG): 13.37sec  6 minute walk test: 1257' with no AD 10 meter walk test: 10.67sec(0.29m/s)  Berg Balance Scale: 47 Dynamic Gait Index: 19 Functional gait assessment: 21  PATIENT SURVEYS:  FOTO 55  TODAY'S TREATMENT:  DATE: 05/27/2023    Nustep x 5 min level 2-5 with cues for full ROM in BUE/BLE throughout training on nustep.   Stepping over bolster:  Forward x 8 no resistance. X 8 with 3# ankle weights.   Side stepping x 10 bil with 3# ankle weights   Forward step with forward reach over head 3# wrist weight  Lateral step with shoulder abduction 3# wrist weights  Gait with 3# wrist weights 466ft with emphasis on arm swing  Sit<>stand with UE raise 2 x 10  Forwards step with ipsilateral forward reach 2 x 10 bil.    Throughout session, PT provided CGA for safety to reduce fall risk and verbal and tactile cues for improved trunkal rotation and UE swing as appropriate.   PATIENT EDUCATION: Education details: Pt educated throughout session about proper posture and technique with exercises. Improved exercise technique, movement at target joints, use of target muscles after min to mod verbal, visual, tactile cues. POC, Therapy orientation, rehab goals and expectations  Person educated: Patient and Spouse Education method: Explanation and Demonstration Education comprehension: verbalized understanding  HOME EXERCISE PROGRAM:  Access Code: [email or web address] URL: [email or web address] Date: 04/20/2023 Prepared by: Grier Rocher  Exercises - Standing Hip Extension with Resistance at Ankles and Counter Support  - 1 x daily - 5 x weekly - 3 sets - 10 reps - Standing Hip Abduction with Resistance at Ankles and Counter Support  - 1 x daily - 5 x weekly - 3 sets - 10 reps - Marching with Resistance  - 1 x daily - 5 x weekly - 3 sets - 10 reps   SHORT TERM GOALS: Target date: 07/07/2023    Patient will be independent in home exercise program to improve strength/mobility for better functional independence with ADLs. Baseline: given on 7/9 Goal status: IN PROGRESS   LONG TERM GOALS: Target date: 07/07/2023    Patient will increase FOTO score to equal to or greater than  59  to demonstrate statistically significant improvement in mobility and quality of life.  Baseline: 55 8/13: 48  Goal status: IN PROGRESS   2.  Patient (> 16 years old) will complete five times sit to stand test  in < 15 seconds indicating an increased LE strength and improved balance. Baseline: 16.47 sec  8/13 15.07sec  Goal status: IN PROGRESS  3.  Patient will increase Berg Balance score by > 6 points to demonstrate decreased fall risk during functional activities Baseline: 47/56 8/13:50 Goal status: IN PROGRESS  4.  Patient will increase 10 meter walk test to >1.14m/s as to improve gait speed for better community ambulation and to reduce fall risk. Baseline: 0.70m/s  8/13: normal 0.58m/s. Fast: 1.69m/s Goal status: IN PROGRESS  5.  Patient will reduce timed up and go to <11 seconds to reduce fall risk and demonstrate improved transfer/gait ability. Baseline: 13.37 8/13: 11.42 sec  Goal status: IN PROGRESS  6.  Patient will increase dynamic gait index score to >19/24 as to demonstrate reduced fall risk and improved dynamic gait balance for better safety with community/home ambulation.   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 Min cues for sequencing of movement due to short term memory deficits, but able to improve technique with increased repetitions. Able to improve arm swing with weighted gait that persisted upon completion without weights in place on BUE. Pt will benefit from skilled PT  to improve balance, reduce fall risk, improve community access.   OBJECTIVE IMPAIRMENTS: Abnormal gait, cardiopulmonary status limiting activity, decreased activity tolerance, decreased balance, decreased cognition, decreased coordination, decreased endurance, decreased knowledge of condition, decreased mobility, difficulty walking, decreased ROM, decreased strength, decreased safety awareness, impaired flexibility, impaired UE functional use, and improper body mechanics.   ACTIVITY LIMITATIONS: carrying, standing, and locomotion level  PARTICIPATION  LIMITATIONS: meal prep, cleaning, laundry, driving, community activity, yard work, and school  PERSONAL FACTORS: Age, Behavior pattern, Education, Fitness, and Time since onset of injury/illness/exacerbation are also affecting patient's functional outcome.   REHAB POTENTIAL: Good  CLINICAL DECISION MAKING: Stable/uncomplicated  EVALUATION COMPLEXITY: Moderate  PLAN:  PT FREQUENCY: 1-2x/week  PT DURATION: 12 weeks  PLANNED INTERVENTIONS: Therapeutic exercises, Therapeutic activity, Neuromuscular re-education, Balance training, Gait training, Patient/Family education, Self Care, Joint mobilization, Stair training, DME instructions, Cognitive remediation, Cryotherapy, Moist heat, Manual therapy, and Re-evaluation  PLAN FOR NEXT SESSION:    Large amplitude movement.  Dynamic balance training.   Golden Pop PT  Physical Therapist-   [facility]     2:10 PM 05/27/23

## 2023-06-01 ENCOUNTER — Ambulatory Visit: Payer: Medicare Other | Admitting: Physical Therapy

## 2023-06-01 DIAGNOSIS — M6281 Muscle weakness (generalized): Secondary | ICD-10-CM | POA: Diagnosis not present

## 2023-06-01 DIAGNOSIS — R278 Other lack of coordination: Secondary | ICD-10-CM

## 2023-06-01 DIAGNOSIS — R2689 Other abnormalities of gait and mobility: Secondary | ICD-10-CM

## 2023-06-01 NOTE — Therapy (Unsigned)
OUTPATIENT PHYSICAL THERAPY NEURO TREATMENT/    Patient Name: Carlos Morrison MRN: [id number] DOB:1941/06/11, 82 y.o., male Today's Date: 06/01/2023   PCP: Barbette Reichmann, MD  REFERRING PROVIDER: Barbette Reichmann, MD   END OF SESSION:  PT End of Session - 06/01/23 1153     Visit Number 12    Number of Visits 24    Date for PT Re-Evaluation 07/07/23    Progress Note Due on Visit 20    PT Start Time 1151    PT Stop Time 1230    PT Time Calculation (min) 39 min    Equipment Utilized During Treatment Gait belt    Activity Tolerance Patient tolerated treatment well;No increased pain    Behavior During Therapy WFL for tasks assessed/performed                Past Medical History:  Diagnosis Date   Arthritis    Bilateral knees   GERD (gastroesophageal reflux disease)    Hyperlipidemia    Past Surgical History:  Procedure Laterality Date   CATARACT EXTRACTION W/PHACO Left 03/30/2018   Procedure: CATARACT EXTRACTION PHACO AND INTRAOCULAR LENS PLACEMENT (IOC)  LEFT;  Surgeon: Lockie Mola, MD;  Location: [facility];  Service: Ophthalmology;  Laterality: Left;   CATARACT EXTRACTION W/PHACO Right 01/29/2021   Procedure: CATARACT EXTRACTION PHACO AND INTRAOCULAR LENS PLACEMENT (IOC) RIGHT;  Surgeon: Lockie Mola, MD;  Location: [facility];  Service: Ophthalmology;  Laterality: Right;  3.24 1:03.0 5.18%   COLONOSCOPY  2011   Dr. Kizzie Ide   COLONOSCOPY N/A 05/01/2015   Procedure: COLONOSCOPY;  Surgeon: Earline Mayotte, MD;  Location: [facility] END[facility]COPY;  Service: Endoscopy;  Laterality: N/A;   GANGLION CYST EXCISION Left 08/2006   Patient Active Problem List   Diagnosis Date Noted   CN (constipation) 03/19/2015    ONSET DATE: fall 2 weeks ago.    REFERRING DIAG:  R26.89 (ICD-10-CM) - Other abnormalities of gait and mobility  Z91.81 (ICD-10-CM) - History of falling    THERAPY DIAG:  Muscle weakness (generalized)  Other abnormalities  of gait and mobility  Other lack of coordination  Rationale for Evaluation and Treatment: Rehabilitation  SUBJECTIVE:                                                                                                                                                                                             SUBJECTIVE STATEMENT:  Patient reports feeling well. Reports that he did not get out of the house much over the weekend due to rain. No falls or medical chages since last session.    Pt accompanied by:  significant other [name] Taras   PERTINENT HISTORY: recent fall.   PAIN:  Are you having pain? No  PRECAUTIONS: None  WEIGHT BEARING RESTRICTIONS: No  FALLS: Has patient fallen in last 6 months? Yes. Number of falls 1  LIVING ENVIRONMENT: Lives with: lives with their spouse Lives in: House/apartment Stairs: Yes: External: 4 steps; on left going up Has following equipment at home: None  PLOF: Independent and Independent with basic ADLs  PATIENT GOALS: improve balance. Reduce fall risk. Per Wife: improve Arm stretch    OBJECTIVE:   DIAGNOSTIC FINDINGS: EXAM: MRI HEAD WITHOUT CONTRAST  IMPRESSION: No evidence of acute intracranial abnormality.   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   Mild generalized cerebral and cerebellar atrophy.   Mild paranasal sinus disease, as described.   Trace fluid within the bilateral mastoid air cells.    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   EDEMA:  WFL  MUSCLE TONE: WFL   MUSCLE LENGTH: WFL  DTRs:  Patella 2+ = Normal  POSTURE: rounded shoulders, forward head, and flexed trunk   LOWER EXTREMITY ROM:     Active  Right Eval Left Eval  Hip flexion [facility]  Hip extension    Hip abduction [facility]   Hip adduction [facility]  Knee flexion [facility] WFL  Knee extension WFL Lacking 5 deg full extnesion   Ankle dorsiflexion WFL WFL   (Blank rows = not tested)  LOWER EXTREMITY MMT:    MMT Right Eval Left Eval  Hip flexion 4+ 4+  Hip extension    Hip abduction 4+ 4+  Hip adduction 5 5  Knee flexion 4+ 4+  Knee extension 5 5  Ankle dorsiflexion 4+ 4+  (Blank rows = not tested)  BED MOBILITY:  Sit to supine Complete Independence Supine to sit Complete Independence  TRANSFERS: Assistive device utilized: None  Sit to stand: Complete Independence Stand to sit: Complete Independence Chair to chair: Complete Independence Floor:  to be assessed   RAMP:  Level of Assistance: CGA Assistive device utilized:  none   CURB:  Level of Assistance: CGA Assistive device utilized: None   STAIRS: Level of Assistance: SBA Stair Negotiation Technique: Step to Pattern with Bilateral Rails Number of Stairs: 4  Height of Stairs: 6  Comments:  1 rail with step to ascent, 2 rails with step through descent   GAIT: Gait pattern: step through pattern, decreased arm swing- Right, decreased arm swing- Left, Left foot flat, shuffling, and narrow BOS Distance walked: 1257 in 6 min walk  Assistive device utilized: None Level of assistance: Complete Independence Comments: increased shuffle/festination as well as tremor in LUE/LLE with fatigue   FUNCTIONAL TESTS:  5 times sit to stand: 16.47sec  Timed up and go (TUG): 13.37sec  6 minute walk test: 1257' with no AD 10 meter walk test: 10.67sec(0.10m/s)  Berg Balance Scale: 47 Dynamic Gait Index: 19 Functional gait assessment: 21  PATIENT SURVEYS:  FOTO 55  TODAY'S TREATMENT:  DATE: 06/01/2023     Gait through rehab gym x 434ft with out resistance. Min cues for improved shoulder extension with arm swing.   Resisted gait  in Matrix cable machine  Forward 35ft x 3; 7.5# Side stepping R x 2, 7.5#   Side stepping L x 2, 7.5#  In parallel bars.  Forwards step with ipsilateral forward reach 2 x 10 bil.  Lateral step with shoulder abduction x 10 bil  Squat with over head reach x 10   Throughout session, PT provided CGA for safety to reduce fall risk and verbal and tactile cues for improved trunkal rotation and UE swing as appropriate.   PATIENT EDUCATION: Education details: Pt educated throughout session about proper posture and technique with exercises. Improved exercise technique, movement at target joints, use of target muscles after min to mod verbal, visual, tactile cues. POC, Therapy orientation, rehab goals and expectations  Person educated: Patient and Spouse Education method: Explanation and Demonstration Education comprehension: verbalized understanding  HOME EXERCISE PROGRAM:  Access Code: [email or web address] URL: [email or web address] Date: 04/20/2023 Prepared by: Grier Rocher  Exercises - Standing Hip Extension with Resistance at Ankles and Counter Support  - 1 x daily - 5 x weekly - 3 sets - 10 reps - Standing Hip Abduction with Resistance at Ankles and Counter Support  - 1 x daily - 5 x weekly - 3 sets - 10 reps - Marching with Resistance  - 1 x daily - 5 x weekly - 3 sets - 10 reps   SHORT TERM GOALS: Target date: 07/07/2023    Patient will be independent in home exercise program to improve strength/mobility for better functional independence with ADLs. Baseline: given on 7/9 Goal status: IN PROGRESS   LONG TERM GOALS: Target date: 07/07/2023    Patient will increase FOTO score to equal to or greater than  59  to demonstrate statistically significant improvement in mobility and quality of life.  Baseline: 55 8/13: 48  Goal status: IN PROGRESS   2.  Patient (> 34 years old) will complete five times sit to stand test in < 15 seconds indicating an increased LE strength and  improved balance. Baseline: 16.47 sec  8/13 15.07sec  Goal status: IN PROGRESS  3.  Patient will increase Berg Balance score by > 6 points to demonstrate decreased fall risk during functional activities Baseline: 47/56 8/13:50 Goal status: IN PROGRESS  4.  Patient will increase 10 meter walk test to >1.45m/s as to improve gait speed for better community ambulation and to reduce fall risk. Baseline: 0.24m/s  8/13: normal 0.12m/s. Fast: 1.56m/s Goal status: IN PROGRESS  5.  Patient will reduce timed up and go to <11 seconds to reduce fall risk and demonstrate improved transfer/gait ability. Baseline: 13.37 8/13: 11.42 sec  Goal status: IN PROGRESS  6.  Patient will increase dynamic gait index score to >19/24 as to demonstrate reduced fall risk and improved dynamic gait balance for better safety with community/home ambulation.   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 Pt required multimodal cues from PT for sequencing of movement as well as improved step length with resisted gait to prevent festinations. Note to have mild increased UE arm swing with long distance gait compared to eval. Pt will benefit from skilled PT to improve balance, reduce fall risk, improve community access.   OBJECTIVE IMPAIRMENTS: Abnormal gait, cardiopulmonary status limiting activity,  decreased activity tolerance, decreased balance, decreased cognition, decreased coordination, decreased endurance, decreased knowledge of condition, decreased mobility, difficulty walking, decreased ROM, decreased strength, decreased safety awareness, impaired flexibility, impaired UE functional use, and improper body mechanics.   ACTIVITY LIMITATIONS: carrying, standing, and locomotion level  PARTICIPATION LIMITATIONS: meal prep, cleaning, laundry, driving, community activity, yard  work, and school  PERSONAL FACTORS: Age, Behavior pattern, Education, Fitness, and Time since onset of injury/illness/exacerbation are also affecting patient's functional outcome.   REHAB POTENTIAL: Good  CLINICAL DECISION MAKING: Stable/uncomplicated  EVALUATION COMPLEXITY: Moderate  PLAN:  PT FREQUENCY: 1-2x/week  PT DURATION: 12 weeks  PLANNED INTERVENTIONS: Therapeutic exercises, Therapeutic activity, Neuromuscular re-education, Balance training, Gait training, Patient/Family education, Self Care, Joint mobilization, Stair training, DME instructions, Cognitive remediation, Cryotherapy, Moist heat, Manual therapy, and Re-evaluation  PLAN FOR NEXT SESSION:    Large amplitude movement.  Gait in simulated community environment  Golden Pop PT  Physical Therapist- [facility]  [facility]     11:54 AM 06/01/23

## 2023-06-03 ENCOUNTER — Ambulatory Visit: Payer: Medicare Other | Admitting: Physical Therapy

## 2023-06-03 DIAGNOSIS — M6281 Muscle weakness (generalized): Secondary | ICD-10-CM

## 2023-06-03 DIAGNOSIS — R2689 Other abnormalities of gait and mobility: Secondary | ICD-10-CM

## 2023-06-03 DIAGNOSIS — R278 Other lack of coordination: Secondary | ICD-10-CM

## 2023-06-03 NOTE — Therapy (Signed)
OUTPATIENT PHYSICAL THERAPY NEURO TREATMENT/    Patient Name: Carlos Morrison MRN: [id number] DOB:01-Sep-1941, 82 y.o., male Today's Date: 06/03/2023   PCP: Barbette Reichmann, MD  REFERRING PROVIDER: Barbette Reichmann, MD   END OF SESSION:  PT End of Session - 06/03/23 1113     Visit Number 13    Number of Visits 24    Date for PT Re-Evaluation 07/07/23    Progress Note Due on Visit 20    PT Start Time 1108    PT Stop Time 1146    PT Time Calculation (min) 38 min    Equipment Utilized During Treatment Gait belt    Activity Tolerance Patient tolerated treatment well;No increased pain    Behavior During Therapy WFL for tasks assessed/performed                Past Medical History:  Diagnosis Date   Arthritis    Bilateral knees   GERD (gastroesophageal reflux disease)    Hyperlipidemia    Past Surgical History:  Procedure Laterality Date   CATARACT EXTRACTION W/PHACO Left 03/30/2018   Procedure: CATARACT EXTRACTION PHACO AND INTRAOCULAR LENS PLACEMENT (IOC)  LEFT;  Surgeon: Lockie Mola, MD;  Location: [facility];  Service: Ophthalmology;  Laterality: Left;   CATARACT EXTRACTION W/PHACO Right 01/29/2021   Procedure: CATARACT EXTRACTION PHACO AND INTRAOCULAR LENS PLACEMENT (IOC) RIGHT;  Surgeon: Lockie Mola, MD;  Location: [facility];  Service: Ophthalmology;  Laterality: Right;  3.24 1:03.0 5.18%   COLONOSCOPY  2011   Dr. Kizzie Ide   COLONOSCOPY N/A 05/01/2015   Procedure: COLONOSCOPY;  Surgeon: Earline Mayotte, MD;  Location: [facility];  Service: Endoscopy;  Laterality: N/A;   GANGLION CYST EXCISION Left 08/2006   Patient Active Problem List   Diagnosis Date Noted   CN (constipation) 03/19/2015    ONSET DATE: fall 2 weeks ago.    REFERRING DIAG:  R26.89 (ICD-10-CM) - Other abnormalities of gait and mobility  Z91.81 (ICD-10-CM) - History of falling    THERAPY DIAG:  Muscle weakness (generalized)  Other abnormalities  of gait and mobility  Other lack of coordination  Rationale for Evaluation and Treatment: Rehabilitation  SUBJECTIVE:                                                                                                                                                                                             SUBJECTIVE STATEMENT:  Patient reports feeling well. No changes since last PT treatment, reports inconsistent HEP completion.    Pt accompanied by: significant other Carlos Morrison   PERTINENT HISTORY: recent fall.   PAIN:  Are  you having pain? No  PRECAUTIONS: None  WEIGHT BEARING RESTRICTIONS: No  FALLS: Has patient fallen in last 6 months? Yes. Number of falls 1  LIVING ENVIRONMENT: Lives with: lives with their spouse Lives in: House/apartment Stairs: Yes: External: 4 steps; on left going up Has following equipment at home: None  PLOF: Independent and Independent with basic ADLs  PATIENT GOALS: improve balance. Reduce fall risk. Per Wife: improve Arm stretch    OBJECTIVE:   DIAGNOSTIC FINDINGS: EXAM: MRI HEAD WITHOUT CONTRAST  IMPRESSION: No evidence of acute intracranial abnormality.   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   Mild generalized cerebral and cerebellar atrophy.   Mild paranasal sinus disease, as described.   Trace fluid within the bilateral mastoid air cells.    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   EDEMA:  WFL  MUSCLE TONE: WFL   MUSCLE LENGTH: WFL  DTRs:  Patella 2+ = Normal  POSTURE: rounded shoulders, forward head, and flexed trunk   LOWER EXTREMITY ROM:     Active  Right Eval Left Eval  Hip flexion [facility]  Hip extension    Hip abduction [facility]  Hip adduction [facility]  Knee flexion [facility] WFL  Knee  extension WFL Lacking 5 deg full extnesion   Ankle dorsiflexion WFL WFL   (Blank rows = not tested)  LOWER EXTREMITY MMT:    MMT Right Eval Left Eval  Hip flexion 4+ 4+  Hip extension    Hip abduction 4+ 4+  Hip adduction 5 5  Knee flexion 4+ 4+  Knee extension 5 5  Ankle dorsiflexion 4+ 4+  (Blank rows = not tested)  BED MOBILITY:  Sit to supine Complete Independence Supine to sit Complete Independence  TRANSFERS: Assistive device utilized: None  Sit to stand: Complete Independence Stand to sit: Complete Independence Chair to chair: Complete Independence Floor:  to be assessed   RAMP:  Level of Assistance: CGA Assistive device utilized:  none   CURB:  Level of Assistance: CGA Assistive device utilized: None   STAIRS: Level of Assistance: SBA Stair Negotiation Technique: Step to Pattern with Bilateral Rails Number of Stairs: 4  Height of Stairs: 6  Comments:  1 rail with step to ascent, 2 rails with step through descent   GAIT: Gait pattern: step through pattern, decreased arm swing- Right, decreased arm swing- Left, Left foot flat, shuffling, and narrow BOS Distance walked: 1257 in 6 min walk  Assistive device utilized: None Level of assistance: Complete Independence Comments: increased shuffle/festination as well as tremor in LUE/LLE with fatigue   FUNCTIONAL TESTS:  5 times sit to stand: 16.47sec  Timed up and go (TUG): 13.37sec  6 minute walk test: 1257' with no AD 10 meter walk test: 10.67sec(0.56m/s)  Berg Balance Scale: 47 Dynamic Gait Index: 19 Functional gait assessment: 21  PATIENT SURVEYS:  FOTO 55  TODAY'S TREATMENT:  DATE: 06/03/2023    NRM:  Seated  foot touch>sit up with open BUE 2 x 10 .  Lateral step and contralateral reach x 10 bil  Open/close book with trunk rotation x 12 bil   Standing:  Sit<>stand  with overhead reach  x 10  Sit<>stand with fast shoulder extension x 12  Standing cross body reach x 12  Forward step with UE opening. X 10 bil   Dynamic gait with 2.5 weights on wrist through hospital, and over cement sidewalk [place] 2 x 143ft. Additional gait with resistance on ankles 2.5 # weights over cement sidewalk  and through grass x 548ft  Supervision assist from PT throughout with min cues for improved UE swing in BUE as well as improved step length/height in BLE. No overt LOB, was able to correct arm swing and step height following instruction from PT.    Throughout session, PT provided CGA for safety to reduce fall risk and verbal and tactile cues for improved trunkal rotation and UE swing as appropriate.   PATIENT EDUCATION: Education details: Pt educated throughout session about proper posture and technique with exercises. Improved exercise technique, movement at target joints, use of target muscles after min to mod verbal, visual, tactile cues. POC, Therapy orientation, rehab goals and expectations  Person educated: Patient and Spouse Education method: Explanation and Demonstration Education comprehension: verbalized understanding  HOME EXERCISE PROGRAM: Access Code: [email or web address] URL: [email or web address] Date: 06/03/2023 Prepared by: Grier Rocher  Exercises - Seated Reaching to Side and Across Body  - 1 x daily - 7 x weekly - 3 sets - 10 reps - Seated Reach Forward, Up, and To Sides  - 1 x daily - 7 x weekly - 3 sets - 10 reps - Standing Reach to Opposite Side with Weight Shift  - 1 x daily - 7 x weekly - 3 sets - 10 reps - Step Forward with Arms Reaching to Sides  - 1 x daily - 7 x weekly - 3 sets - 10 reps - Sit to Stand with Arm Reach and Jump  - 1 x daily - 7 x weekly - 3 sets - 10 reps  Access Code: [email or web address] URL: [email or web address] Date: 04/20/2023 Prepared by: Grier Rocher  Exercises - Standing Hip Extension with  Resistance at Ankles and Counter Support  - 1 x daily - 5 x weekly - 3 sets - 10 reps - Standing Hip Abduction with Resistance at Ankles and Counter Support  - 1 x daily - 5 x weekly - 3 sets - 10 reps - Marching with Resistance  - 1 x daily - 5 x weekly - 3 sets - 10 reps   SHORT TERM GOALS: Target date: 07/07/2023    Patient will be independent in home exercise program to improve strength/mobility for better functional independence with ADLs. Baseline: given on 7/9 Goal status: IN PROGRESS   LONG TERM GOALS: Target date: 07/07/2023    Patient will increase FOTO score to equal to or greater than  59  to demonstrate statistically significant improvement in mobility and quality of life.  Baseline: 55 8/13: 48  Goal status: IN PROGRESS   2.  Patient (> 66 years old) will complete five times sit to stand test in < 15 seconds indicating an increased LE strength and improved balance. Baseline: 16.47 sec  8/13 15.07sec  Goal status: IN PROGRESS  3.  Patient will increase Berg Balance score by > 6 points to demonstrate decreased fall risk during functional activities  Baseline: 47/56 8/13:50 Goal status: IN PROGRESS  4.  Patient will increase 10 meter walk test to >1.23m/s as to improve gait speed for better community ambulation and to reduce fall risk. Baseline: 0.57m/s  8/13: normal 0.3m/s. Fast: 1.26m/s Goal status: IN PROGRESS  5.  Patient will reduce timed up and go to <11 seconds to reduce fall risk and demonstrate improved transfer/gait ability. Baseline: 13.37 8/13: 11.42 sec  Goal status: IN PROGRESS  6.  Patient will increase dynamic gait index score to >19/24 as to demonstrate reduced fall risk and improved dynamic gait balance for better safety with community/home ambulation.   Baseline: 19/24 8/13: 21/24 Goal status: MET  ASSESSMENT:  CLINICAL IMPRESSION: Pt put forth excellent effort throughout session for treatment interventions to address PD deficits. PT  expanded HEP for dynamic and rotational movement for PD deficits with hand out provided. Educated pt and wife on importance of completing daily to maximize PT outcomes. Pt will benefit from skilled PT to improve balance, reduce fall risk, improve community access.   OBJECTIVE IMPAIRMENTS: Abnormal gait, cardiopulmonary status limiting activity, decreased activity tolerance, decreased balance, decreased cognition, decreased coordination, decreased endurance, decreased knowledge of condition, decreased mobility, difficulty walking, decreased ROM, decreased strength, decreased safety awareness, impaired flexibility, impaired UE functional use, and improper body mechanics.   ACTIVITY LIMITATIONS: carrying, standing, and locomotion level  PARTICIPATION LIMITATIONS: meal prep, cleaning, laundry, driving, community activity, yard work, and school  PERSONAL FACTORS: Age, Behavior pattern, Education, Fitness, and Time since onset of injury/illness/exacerbation are also affecting patient's functional outcome.   REHAB POTENTIAL: Good  CLINICAL DECISION MAKING: Stable/uncomplicated  EVALUATION COMPLEXITY: Moderate  PLAN:  PT FREQUENCY: 1-2x/week  PT DURATION: 12 weeks  PLANNED INTERVENTIONS: Therapeutic exercises, Therapeutic activity, Neuromuscular re-education, Balance training, Gait training, Patient/Family education, Self Care, Joint mobilization, Stair training, DME instructions, Cognitive remediation, Cryotherapy, Moist heat, Manual therapy, and Re-evaluation  PLAN FOR NEXT SESSION:    Large amplitude movement.  Endurance training.   Golden Pop PT  Physical Therapist- [facility]  [facility]     11:28 AM 06/03/23

## 2023-06-08 ENCOUNTER — Ambulatory Visit: Payer: Medicare Other | Admitting: Physical Therapy

## 2023-06-08 DIAGNOSIS — M6281 Muscle weakness (generalized): Secondary | ICD-10-CM

## 2023-06-08 DIAGNOSIS — R278 Other lack of coordination: Secondary | ICD-10-CM

## 2023-06-08 DIAGNOSIS — R2689 Other abnormalities of gait and mobility: Secondary | ICD-10-CM

## 2023-06-08 NOTE — Therapy (Signed)
OUTPATIENT PHYSICAL THERAPY NEURO TREATMENT/    Patient Name: Carlos Morrison MRN: [id number] DOB:28-Sep-1941, 82 y.o., male Today's Date: 06/08/2023   PCP: Barbette Reichmann, MD  REFERRING PROVIDER: Barbette Reichmann, MD   END OF SESSION:  PT End of Session - 06/08/23 1615     Visit Number 14    Number of Visits 24    Date for PT Re-Evaluation 07/07/23    Progress Note Due on Visit 20    PT Start Time 1616    PT Stop Time 1700    PT Time Calculation (min) 44 min    Equipment Utilized During Treatment Gait belt    Activity Tolerance Patient tolerated treatment well;No increased pain    Behavior During Therapy WFL for tasks assessed/performed                Past Medical History:  Diagnosis Date   Arthritis    Bilateral knees   GERD (gastroesophageal reflux disease)    Hyperlipidemia    Past Surgical History:  Procedure Laterality Date   CATARACT EXTRACTION W/PHACO Left 03/30/2018   Procedure: CATARACT EXTRACTION PHACO AND INTRAOCULAR LENS PLACEMENT (IOC)  LEFT;  Surgeon: Lockie Mola, MD;  Location: [facility];  Service: Ophthalmology;  Laterality: Left;   CATARACT EXTRACTION W/PHACO Right 01/29/2021   Procedure: CATARACT EXTRACTION PHACO AND INTRAOCULAR LENS PLACEMENT (IOC) RIGHT;  Surgeon: Lockie Mola, MD;  Location: [facility];  Service: Ophthalmology;  Laterality: Right;  3.24 1:03.0 5.18%   COLONOSCOPY  2011   Dr. Kizzie Ide   COLONOSCOPY N/A 05/01/2015   Procedure: COLONOSCOPY;  Surgeon: Earline Mayotte, MD;  Location: [facility];  Service: Endoscopy;  Laterality: N/A;   GANGLION CYST EXCISION Left 08/2006   Patient Active Problem List   Diagnosis Date Noted   CN (constipation) 03/19/2015    ONSET DATE: fall 2 weeks ago.    REFERRING DIAG:  R26.89 (ICD-10-CM) - Other abnormalities of gait and mobility  Z91.81 (ICD-10-CM) - History of falling    THERAPY DIAG:  Muscle weakness (generalized)  Other abnormalities  of gait and mobility  Other lack of coordination  Rationale for Evaluation and Treatment: Rehabilitation  SUBJECTIVE:                                                                                                                                                                                             SUBJECTIVE STATEMENT:  Patient reports feeling well. Mild L knee pain. But not bad enough to rate on this day.    Pt accompanied by: significant other [name] Nolasco   PERTINENT HISTORY: recent fall.  PAIN:  Are you having pain? No  PRECAUTIONS: None  WEIGHT BEARING RESTRICTIONS: No  FALLS: Has patient fallen in last 6 months? Yes. Number of falls 1  LIVING ENVIRONMENT: Lives with: lives with their spouse Lives in: House/apartment Stairs: Yes: External: 4 steps; on left going up Has following equipment at home: None  PLOF: Independent and Independent with basic ADLs  PATIENT GOALS: improve balance. Reduce fall risk. Per Wife: improve Arm stretch    OBJECTIVE:   DIAGNOSTIC FINDINGS: EXAM: MRI HEAD WITHOUT CONTRAST  IMPRESSION: No evidence of acute intracranial abnormality.   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   Mild generalized cerebral and cerebellar atrophy.   Mild paranasal sinus disease, as described.   Trace fluid within the bilateral mastoid air cells.    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   EDEMA:  WFL  MUSCLE TONE: WFL   MUSCLE LENGTH: WFL  DTRs:  Patella 2+ = Normal  POSTURE: rounded shoulders, forward head, and flexed trunk   LOWER EXTREMITY ROM:     Active  Right Eval Left Eval  Hip flexion [facility]  Hip extension    Hip abduction [facility]  Hip adduction [facility]  Knee flexion [facility] WFL  Knee extension WFL  Lacking 5 deg full extnesion   Ankle dorsiflexion WFL WFL   (Blank rows = not tested)  LOWER EXTREMITY MMT:    MMT Right Eval Left Eval  Hip flexion 4+ 4+  Hip extension    Hip abduction 4+ 4+  Hip adduction 5 5  Knee flexion 4+ 4+  Knee extension 5 5  Ankle dorsiflexion 4+ 4+  (Blank rows = not tested)  BED MOBILITY:  Sit to supine Complete Independence Supine to sit Complete Independence  TRANSFERS: Assistive device utilized: None  Sit to stand: Complete Independence Stand to sit: Complete Independence Chair to chair: Complete Independence Floor:  to be assessed   RAMP:  Level of Assistance: CGA Assistive device utilized:  none   CURB:  Level of Assistance: CGA Assistive device utilized: None   STAIRS: Level of Assistance: SBA Stair Negotiation Technique: Step to Pattern with Bilateral Rails Number of Stairs: 4  Height of Stairs: 6  Comments:  1 rail with step to ascent, 2 rails with step through descent   GAIT: Gait pattern: step through pattern, decreased arm swing- Right, decreased arm swing- Left, Left foot flat, shuffling, and narrow BOS Distance walked: 1257 in 6 min walk  Assistive device utilized: None Level of assistance: Complete Independence Comments: increased shuffle/festination as well as tremor in LUE/LLE with fatigue   FUNCTIONAL TESTS:  5 times sit to stand: 16.47sec  Timed up and go (TUG): 13.37sec  6 minute walk test: 1257' with no AD 10 meter walk test: 10.67sec(0.60m/s)  Berg Balance Scale: 47 Dynamic Gait Index: 19 Functional gait assessment: 21  PATIENT SURVEYS:  FOTO 55  TODAY'S TREATMENT:  DATE: 06/08/2023    NRM:   Nustep BLE/BUE reciprocal movement training x 5 min level 1-3 with cues for improved trunk rotation.  Seated PWR! Up x 10  Rock x 10 x bil  Twist x 10   Step x 10 bil  Pt noted to  have difficulty with full ROM on the RLE and RUE. Hand out provided and education provided for wife.   Foot tap on 4inch step x 12 bil  Forward step up x 12 bil with no UE support. Mild antalgic gait pattern on the LLE.  Sit<>stand with shoulder abduction to 90deg x 10  Sit<>stand without UE support x 10 with min cues for anterior weight shift.  Dynamic gait with 2.5 weights on wrist through hospital [place] x 35ft with cues and min assist for improved BUE arm swing and scapular retraction/extension in swing.    Throughout session, gait bel tin place and PT provided CGA for safety to reduce fall risk and verbal and tactile cues for improved trunkal rotation and UE swing as appropriate.   PATIENT EDUCATION: Education details: Pt educated throughout session about proper posture and technique with exercises. Improved exercise technique, movement at target joints, use of target muscles after min to mod verbal, visual, tactile cues. POC, Therapy orientation, rehab goals and expectations  Person educated: Patient and Spouse Education method: Explanation and Demonstration Education comprehension: verbalized understanding  HOME EXERCISE PROGRAM: Access Code: [email or web address] URL: [email or web address] Date: 06/03/2023 Prepared by: Grier Rocher  Exercises - Seated Reaching to Side and Across Body  - 1 x daily - 7 x weekly - 3 sets - 10 reps - Seated Reach Forward, Up, and To Sides  - 1 x daily - 7 x weekly - 3 sets - 10 reps - Standing Reach to Opposite Side with Weight Shift  - 1 x daily - 7 x weekly - 3 sets - 10 reps - Step Forward with Arms Reaching to Sides  - 1 x daily - 7 x weekly - 3 sets - 10 reps - Sit to Stand with Arm Reach and Jump  - 1 x daily - 7 x weekly - 3 sets - 10 reps  Access Code: [email or web address] URL: [email or web address] Date: 04/20/2023 Prepared by: Grier Rocher  Exercises - Standing Hip Extension with Resistance at Ankles and Counter Support  - 1 x  daily - 5 x weekly - 3 sets - 10 reps - Standing Hip Abduction with Resistance at Ankles and Counter Support  - 1 x daily - 5 x weekly - 3 sets - 10 reps - Marching with Resistance  - 1 x daily - 5 x weekly - 3 sets - 10 reps   SHORT TERM GOALS: Target date: 07/07/2023    Patient will be independent in home exercise program to improve strength/mobility for better functional independence with ADLs. Baseline: given on 7/9 Goal status: IN PROGRESS   LONG TERM GOALS: Target date: 07/07/2023    Patient will increase FOTO score to equal to or greater than  59  to demonstrate statistically significant improvement in mobility and quality of life.  Baseline: 55 8/13: 48  Goal status: IN PROGRESS   2.  Patient (> 30 years old) will complete five times sit to stand test in < 15 seconds indicating an increased LE strength and improved balance. Baseline: 16.47 sec  8/13 15.07sec  Goal status: IN PROGRESS  3.  Patient will increase Berg Balance score by > 6 points to demonstrate decreased fall risk during  functional activities Baseline: 47/56 8/13:50 Goal status: IN PROGRESS  4.  Patient will increase 10 meter walk test to >1.32m/s as to improve gait speed for better community ambulation and to reduce fall risk. Baseline: 0.61m/s  8/13: normal 0.41m/s. Fast: 1.47m/s Goal status: IN PROGRESS  5.  Patient will reduce timed up and go to <11 seconds to reduce fall risk and demonstrate improved transfer/gait ability. Baseline: 13.37 8/13: 11.42 sec  Goal status: IN PROGRESS  6.  Patient will increase dynamic gait index score to >19/24 as to demonstrate reduced fall risk and improved dynamic gait balance for better safety with community/home ambulation.   Baseline: 19/24 8/13: 21/24 Goal status: MET  ASSESSMENT:  CLINICAL IMPRESSION: Pt put forth excellent effort throughout session for treatment interventions to address PD deficits. PT provided pt with PWR! Hand out for seated PD HEP.  Wife able to verbalize understanding of HEP and will encourage pt to perform at home. Continue to re-assess. Pt will benefit from skilled PT to improve balance, reduce fall risk, improve community access.   OBJECTIVE IMPAIRMENTS: Abnormal gait, cardiopulmonary status limiting activity, decreased activity tolerance, decreased balance, decreased cognition, decreased coordination, decreased endurance, decreased knowledge of condition, decreased mobility, difficulty walking, decreased ROM, decreased strength, decreased safety awareness, impaired flexibility, impaired UE functional use, and improper body mechanics.   ACTIVITY LIMITATIONS: carrying, standing, and locomotion level  PARTICIPATION LIMITATIONS: meal prep, cleaning, laundry, driving, community activity, yard work, and school  PERSONAL FACTORS: Age, Behavior pattern, Education, Fitness, and Time since onset of injury/illness/exacerbation are also affecting patient's functional outcome.   REHAB POTENTIAL: Good  CLINICAL DECISION MAKING: Stable/uncomplicated  EVALUATION COMPLEXITY: Moderate  PLAN:  PT FREQUENCY: 1-2x/week  PT DURATION: 12 weeks  PLANNED INTERVENTIONS: Therapeutic exercises, Therapeutic activity, Neuromuscular re-education, Balance training, Gait training, Patient/Family education, Self Care, Joint mobilization, Stair training, DME instructions, Cognitive remediation, Cryotherapy, Moist heat, Manual therapy, and Re-evaluation  PLAN FOR NEXT SESSION:    Large amplitude movement.  Endurance training.  Assess HEP compliance  Golden Pop PT  Physical Therapist- [facility]  [facility]     4:15 PM 06/08/23

## 2023-06-10 ENCOUNTER — Ambulatory Visit: Payer: Medicare Other | Admitting: Physical Therapy

## 2023-06-10 DIAGNOSIS — R2689 Other abnormalities of gait and mobility: Secondary | ICD-10-CM

## 2023-06-10 DIAGNOSIS — M6281 Muscle weakness (generalized): Secondary | ICD-10-CM

## 2023-06-10 DIAGNOSIS — R278 Other lack of coordination: Secondary | ICD-10-CM

## 2023-06-10 NOTE — Therapy (Signed)
OUTPATIENT PHYSICAL THERAPY NEURO TREATMENT/    Patient Name: Carlos Morrison MRN: [id number] DOB:03/20/1941, 82 y.o., male Today's Date: 06/10/2023   PCP: Barbette Reichmann, MD  REFERRING PROVIDER: Barbette Reichmann, MD   END OF SESSION:  PT End of Session - 06/10/23 1146     Visit Number 15    Number of Visits 24    Date for PT Re-Evaluation 07/07/23    Progress Note Due on Visit 20    PT Start Time 1146    PT Stop Time 1226    PT Time Calculation (min) 40 min    Equipment Utilized During Treatment Gait belt    Activity Tolerance Patient tolerated treatment well;No increased pain    Behavior During Therapy WFL for tasks assessed/performed                Past Medical History:  Diagnosis Date   Arthritis    Bilateral knees   GERD (gastroesophageal reflux disease)    Hyperlipidemia    Past Surgical History:  Procedure Laterality Date   CATARACT EXTRACTION W/PHACO Left 03/30/2018   Procedure: CATARACT EXTRACTION PHACO AND INTRAOCULAR LENS PLACEMENT (IOC)  LEFT;  Surgeon: Lockie Mola, MD;  Location: [facility];  Service: Ophthalmology;  Laterality: Left;   CATARACT EXTRACTION W/PHACO Right 01/29/2021   Procedure: CATARACT EXTRACTION PHACO AND INTRAOCULAR LENS PLACEMENT (IOC) RIGHT;  Surgeon: Lockie Mola, MD;  Location: [facility];  Service: Ophthalmology;  Laterality: Right;  3.24 1:03.0 5.18%   COLONOSCOPY  2011   Dr. Kizzie Ide   COLONOSCOPY N/A 05/01/2015   Procedure: COLONOSCOPY;  Surgeon: Earline Mayotte, MD;  Location: [facility];  Service: Endoscopy;  Laterality: N/A;   GANGLION CYST EXCISION Left 08/2006   Patient Active Problem List   Diagnosis Date Noted   CN (constipation) 03/19/2015    ONSET DATE: fall 2 weeks ago.    REFERRING DIAG:  R26.89 (ICD-10-CM) - Other abnormalities of gait and mobility  Z91.81 (ICD-10-CM) - History of falling    THERAPY DIAG:  Muscle weakness (generalized)  Other abnormalities  of gait and mobility  Other lack of coordination  Rationale for Evaluation and Treatment: Rehabilitation  SUBJECTIVE:                                                                                                                                                                                             SUBJECTIVE STATEMENT:  Patient reports feeling well. Mild L knee pain. But not bad enough to rate on this day.    Pt accompanied by: significant other [name] Hurd   PERTINENT HISTORY: recent fall.  PAIN:  Are you having pain? No  PRECAUTIONS: None  WEIGHT BEARING RESTRICTIONS: No  FALLS: Has patient fallen in last 6 months? Yes. Number of falls 1  LIVING ENVIRONMENT: Lives with: lives with their spouse Lives in: House/apartment Stairs: Yes: External: 4 steps; on left going up Has following equipment at home: None  PLOF: Independent and Independent with basic ADLs  PATIENT GOALS: improve balance. Reduce fall risk. Per Wife: improve Arm stretch    OBJECTIVE:   DIAGNOSTIC FINDINGS: EXAM: MRI HEAD WITHOUT CONTRAST  IMPRESSION: No evidence of acute intracranial abnormality.   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   Mild generalized cerebral and cerebellar atrophy.   Mild paranasal sinus disease, as described.   Trace fluid within the bilateral mastoid air cells.    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   EDEMA:  WFL  MUSCLE TONE: WFL   MUSCLE LENGTH: WFL  DTRs:  Patella 2+ = Normal  POSTURE: rounded shoulders, forward head, and flexed trunk   LOWER EXTREMITY ROM:     Active  Right Eval Left Eval  Hip flexion [facility]  Hip extension    Hip abduction [facility]  Hip adduction [facility]  Knee flexion [facility] WFL  Knee extension WFL  Lacking 5 deg full extnesion   Ankle dorsiflexion WFL WFL   (Blank rows = not tested)  LOWER EXTREMITY MMT:    MMT Right Eval Left Eval  Hip flexion 4+ 4+  Hip extension    Hip abduction 4+ 4+  Hip adduction 5 5  Knee flexion 4+ 4+  Knee extension 5 5  Ankle dorsiflexion 4+ 4+  (Blank rows = not tested)  BED MOBILITY:  Sit to supine Complete Independence Supine to sit Complete Independence  TRANSFERS: Assistive device utilized: None  Sit to stand: Complete Independence Stand to sit: Complete Independence Chair to chair: Complete Independence Floor:  to be assessed   RAMP:  Level of Assistance: CGA Assistive device utilized:  none   CURB:  Level of Assistance: CGA Assistive device utilized: None   STAIRS: Level of Assistance: SBA Stair Negotiation Technique: Step to Pattern with Bilateral Rails Number of Stairs: 4  Height of Stairs: 6  Comments:  1 rail with step to ascent, 2 rails with step through descent   GAIT: Gait pattern: step through pattern, decreased arm swing- Right, decreased arm swing- Left, Left foot flat, shuffling, and narrow BOS Distance walked: 1257 in 6 min walk  Assistive device utilized: None Level of assistance: Complete Independence Comments: increased shuffle/festination as well as tremor in LUE/LLE with fatigue   FUNCTIONAL TESTS:  5 times sit to stand: 16.47sec  Timed up and go (TUG): 13.37sec  6 minute walk test: 1257' with no AD 10 meter walk test: 10.67sec(0.54m/s)  Berg Balance Scale: 47 Dynamic Gait Index: 19 Functional gait assessment: 21  PATIENT SURVEYS:  FOTO 55  TODAY'S TREATMENT:  DATE: 06/10/2023    NRM:   Nustep BLE/BUE reciprocal movement training x 6 min level 4 for 5 min and level 1 x 1 min for cool down.  with cues for improved trunk rotation.  Seated PWR! Up x 12  Rock x 12 x bil   Twist x 12   Step x 10 bil  Improved ROM for twist and step compared to PT session on Tuesday of this week. Pt also noted to have improved posture and trunk rotation with weight shift, step and twist compared to prior session.   Foot tap on 4inch step x 12 bil  Forward step up x 12 bil with no UE support. Mild antalgic gait pattern on the LLE.  Sit<>stand with shoulder abduction to 90deg x 10  Sit<>stand without UE support x 10 with min cues for anterior weight shift.  Dynamic gait with 2.5 weights on wrist through hospital [place] x 310ft with cues and min assist for improved BUE arm swing and scapular retraction/extension in swing.    Throughout session, gait bel tin place and PT provided CGA for safety to reduce fall risk and verbal and tactile cues for improved trunkal rotation and UE swing as appropriate.   PATIENT EDUCATION: Education details: Pt educated throughout session about proper posture and technique with exercises. Improved exercise technique, movement at target joints, use of target muscles after min to mod verbal, visual, tactile cues. POC, Therapy orientation, rehab goals and expectations  Person educated: Patient and Spouse Education method: Explanation and Demonstration Education comprehension: verbalized understanding  HOME EXERCISE PROGRAM: Access Code: [email or web address] URL: [email or web address] Date: 06/03/2023 Prepared by: Grier Rocher  Exercises - Seated Reaching to Side and Across Body  - 1 x daily - 7 x weekly - 3 sets - 10 reps - Seated Reach Forward, Up, and To Sides  - 1 x daily - 7 x weekly - 3 sets - 10 reps - Standing Reach to Opposite Side with Weight Shift  - 1 x daily - 7 x weekly - 3 sets - 10 reps - Step Forward with Arms Reaching to Sides  - 1 x daily - 7 x weekly - 3 sets - 10 reps - Sit to Stand with Arm Reach and Jump  - 1 x daily - 7 x weekly - 3 sets - 10 reps  Access Code: [email or web address] URL: [email or web address] Date:  04/20/2023 Prepared by: Grier Rocher  Exercises - Standing Hip Extension with Resistance at Ankles and Counter Support  - 1 x daily - 5 x weekly - 3 sets - 10 reps - Standing Hip Abduction with Resistance at Ankles and Counter Support  - 1 x daily - 5 x weekly - 3 sets - 10 reps - Marching with Resistance  - 1 x daily - 5 x weekly - 3 sets - 10 reps   SHORT TERM GOALS: Target date: 07/07/2023    Patient will be independent in home exercise program to improve strength/mobility for better functional independence with ADLs. Baseline: given on 7/9 Goal status: IN PROGRESS   LONG TERM GOALS: Target date: 07/07/2023    Patient will increase FOTO score to equal to or greater than  59  to demonstrate statistically significant improvement in mobility and quality of life.  Baseline: 55 8/13: 48  Goal status: IN PROGRESS   2.  Patient (> 46 years old) will complete five times sit to stand test in < 15 seconds indicating an increased LE strength and improved balance. Baseline: 16.47 sec  8/13 15.07sec  Goal status: IN PROGRESS  3.  Patient will increase Berg Balance score by > 6 points to demonstrate decreased fall risk during functional activities Baseline: 47/56 8/13:50 Goal status: IN PROGRESS  4.  Patient will increase 10 meter walk test to >1.84m/s as to improve gait speed for better community ambulation and to reduce fall risk. Baseline: 0.4m/s  8/13: normal 0.41m/s. Fast: 1.73m/s Goal status: IN PROGRESS  5.  Patient will reduce timed up and go to <11 seconds to reduce fall risk and demonstrate improved transfer/gait ability. Baseline: 13.37 8/13: 11.42 sec  Goal status: IN PROGRESS  6.  Patient will increase dynamic gait index score to >19/24 as to demonstrate reduced fall risk and improved dynamic gait balance for better safety with community/home ambulation.   Baseline: 19/24 8/13: 21/24 Goal status: MET  ASSESSMENT:  CLINICAL IMPRESSION: Pt put forth excellent  effort throughout session for treatment interventions to address PD deficits. Pt Demonstrateing improved ROM and technique/posture for PWR! Seated exercises. Will initiate standing PWR! In upcoming sessions. Pt will benefit from skilled PT to improve balance, reduce fall risk, improve community access.   OBJECTIVE IMPAIRMENTS: Abnormal gait, cardiopulmonary status limiting activity, decreased activity tolerance, decreased balance, decreased cognition, decreased coordination, decreased endurance, decreased knowledge of condition, decreased mobility, difficulty walking, decreased ROM, decreased strength, decreased safety awareness, impaired flexibility, impaired UE functional use, and improper body mechanics.   ACTIVITY LIMITATIONS: carrying, standing, and locomotion level  PARTICIPATION LIMITATIONS: meal prep, cleaning, laundry, driving, community activity, yard work, and school  PERSONAL FACTORS: Age, Behavior pattern, Education, Fitness, and Time since onset of injury/illness/exacerbation are also affecting patient's functional outcome.   REHAB POTENTIAL: Good  CLINICAL DECISION MAKING: Stable/uncomplicated  EVALUATION COMPLEXITY: Moderate  PLAN:  PT FREQUENCY: 1-2x/week  PT DURATION: 12 weeks  PLANNED INTERVENTIONS: Therapeutic exercises, Therapeutic activity, Neuromuscular re-education, Balance training, Gait training, Patient/Family education, Self Care, Joint mobilization, Stair training, DME instructions, Cognitive remediation, Cryotherapy, Moist heat, Manual therapy, and Re-evaluation  PLAN FOR NEXT SESSION:    Large amplitude movement.  Endurance training.  Standing PWR!   Golden Pop PT  Physical Therapist- [place]  [facility]     11:47 AM 06/10/23

## 2023-06-16 ENCOUNTER — Ambulatory Visit: Payer: Medicare Other | Attending: Internal Medicine

## 2023-06-16 DIAGNOSIS — M6281 Muscle weakness (generalized): Secondary | ICD-10-CM | POA: Diagnosis present

## 2023-06-16 DIAGNOSIS — R2689 Other abnormalities of gait and mobility: Secondary | ICD-10-CM | POA: Diagnosis present

## 2023-06-16 DIAGNOSIS — R278 Other lack of coordination: Secondary | ICD-10-CM | POA: Diagnosis present

## 2023-06-16 NOTE — Therapy (Signed)
OUTPATIENT PHYSICAL THERAPY NEURO TREATMENT/    Patient Name: Carlos Morrison MRN: [id number] DOB:May 03, 1941, 82 y.o., male Today's Date: 06/16/2023   PCP: Barbette Reichmann, MD  REFERRING PROVIDER: Barbette Reichmann, MD   END OF SESSION:  PT End of Session - 06/16/23 1404     Visit Number 16    Number of Visits 24    Date for PT Re-Evaluation 07/07/23    Progress Note Due on Visit 20    PT Start Time 1404    PT Stop Time 1445    PT Time Calculation (min) 41 min    Equipment Utilized During Treatment Gait belt    Activity Tolerance Patient tolerated treatment well;No increased pain    Behavior During Therapy WFL for tasks assessed/performed                Past Medical History:  Diagnosis Date   Arthritis    Bilateral knees   GERD (gastroesophageal reflux disease)    Hyperlipidemia    Past Surgical History:  Procedure Laterality Date   CATARACT EXTRACTION W/PHACO Left 03/30/2018   Procedure: CATARACT EXTRACTION PHACO AND INTRAOCULAR LENS PLACEMENT (IOC)  LEFT;  Surgeon: Lockie Mola, MD;  Location: [facility];  Service: Ophthalmology;  Laterality: Left;   CATARACT EXTRACTION W/PHACO Right 01/29/2021   Procedure: CATARACT EXTRACTION PHACO AND INTRAOCULAR LENS PLACEMENT (IOC) RIGHT;  Surgeon: Lockie Mola, MD;  Location: [facility];  Service: Ophthalmology;  Laterality: Right;  3.24 1:03.0 5.18%   COLONOSCOPY  2011   Dr. Kizzie Ide   COLONOSCOPY N/A 05/01/2015   Procedure: COLONOSCOPY;  Surgeon: Earline Mayotte, MD;  Location: [facility];  Service: Endoscopy;  Laterality: N/A;   GANGLION CYST EXCISION Left 08/2006   Patient Active Problem List   Diagnosis Date Noted   CN (constipation) 03/19/2015    ONSET DATE: fall 2 weeks ago.    REFERRING DIAG:  R26.89 (ICD-10-CM) - Other abnormalities of gait and mobility  Z91.81 (ICD-10-CM) - History of falling    THERAPY DIAG:  Muscle weakness (generalized)  Other abnormalities of  gait and mobility  Other lack of coordination  Rationale for Evaluation and Treatment: Rehabilitation  SUBJECTIVE:                                                                                                                                                                                             SUBJECTIVE STATEMENT:  Pt reports mild knee discomfort in the L mostly, but slightly in the R.  Pt notes it to be occurring mostly when placing weight throughout the leg.     Pt accompanied by:  significant other [name] Fines   PERTINENT HISTORY: recent fall.   PAIN:  Are you having pain? No  PRECAUTIONS: None  WEIGHT BEARING RESTRICTIONS: No  FALLS: Has patient fallen in last 6 months? Yes. Number of falls 1  LIVING ENVIRONMENT: Lives with: lives with their spouse Lives in: House/apartment Stairs: Yes: External: 4 steps; on left going up Has following equipment at home: None  PLOF: Independent and Independent with basic ADLs  PATIENT GOALS: improve balance. Reduce fall risk. Per Wife: improve Arm stretch    OBJECTIVE:   DIAGNOSTIC FINDINGS: EXAM: MRI HEAD WITHOUT CONTRAST  IMPRESSION: No evidence of acute intracranial abnormality.   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   Mild generalized cerebral and cerebellar atrophy.   Mild paranasal sinus disease, as described.   Trace fluid within the bilateral mastoid air cells.    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   EDEMA:  WFL  MUSCLE TONE: WFL   MUSCLE LENGTH: WFL  DTRs:  Patella 2+ = Normal  POSTURE: rounded shoulders, forward head, and flexed trunk   LOWER EXTREMITY ROM:     Active  Right Eval Left Eval  Hip flexion [facility]  Hip extension    Hip abduction [facility]  Hip adduction  [facility]  Knee flexion [facility] WFL  Knee extension WFL Lacking 5 deg full extnesion   Ankle dorsiflexion WFL WFL   (Blank rows = not tested)  LOWER EXTREMITY MMT:    MMT Right Eval Left Eval  Hip flexion 4+ 4+  Hip extension    Hip abduction 4+ 4+  Hip adduction 5 5  Knee flexion 4+ 4+  Knee extension 5 5  Ankle dorsiflexion 4+ 4+  (Blank rows = not tested)  BED MOBILITY:  Sit to supine Complete Independence Supine to sit Complete Independence  TRANSFERS: Assistive device utilized: None  Sit to stand: Complete Independence Stand to sit: Complete Independence Chair to chair: Complete Independence Floor:  to be assessed   RAMP:  Level of Assistance: CGA Assistive device utilized:  none   CURB:  Level of Assistance: CGA Assistive device utilized: None   STAIRS: Level of Assistance: SBA Stair Negotiation Technique: Step to Pattern with Bilateral Rails Number of Stairs: 4  Height of Stairs: 6  Comments:  1 rail with step to ascent, 2 rails with step through descent   GAIT: Gait pattern: step through pattern, decreased arm swing- Right, decreased arm swing- Left, Left foot flat, shuffling, and narrow BOS Distance walked: 1257 in 6 min walk  Assistive device utilized: None Level of assistance: Complete Independence Comments: increased shuffle/festination as well as tremor in LUE/LLE with fatigue   FUNCTIONAL TESTS:  5 times sit to stand: 16.47sec  Timed up and go (TUG): 13.37sec  6 minute walk test: 1257' with no AD 10 meter walk test: 10.67sec(0.64m/s)  Berg Balance Scale: 47 Dynamic Gait Index: 19 Functional gait assessment: 21  PATIENT SURVEYS:  FOTO 55  TODAY'S TREATMENT: DATE: 06/16/2023    NRM:   Reciprocal bike for knee mobility, L1, 5 minutes  Seated forward bend, arms reaching down towards the ground, then pt performs large amplitude motion of torso extension returning to upright posture while simultaneously extending arms out by sides in same  motion. 2 x 10  reps.  Seated lateral side bending; ipsilateral arm extended reaching towards floor, contralateral arm extended reaching towards ceiling then shifting and side bending to opposite position. 2 x 10 reps ea side.  Seated tall posture with arms held abducted to 90 deg; Alternating large amplitude movement of one arm across body to other arm performing a "clap" 2 x 10 reps ea side.    Pt Seated with hands on knees, preforming bilateral step to one side resulting in 90 degrees turn in chair, perform same movement back to center of the chair, then to opposite side. 2 x 10 reps.    TherEx:  Seated hamstring stretch, 30 sec bouts x2 each LE  Seated hip adduction into physioball, 3 sec holds, 2x10  Seated LAQ with 3# AW donned, 2x10  Seated marches with 3# AW donned, 2x10  STS, x10  Forward walking in hallway with focus placed on large reciprocal movements while wearing 3# AW, length of hallway x3  Backwards walking in hallway with focus placed on large backwards steps while wearing 3# AW, length of hallway x3  Lateral walking in hallway with focus placed on large lateral steps while wearing 3# AW, length of hallway x3     PATIENT EDUCATION: Education details: Pt educated throughout session about proper posture and technique with exercises. Improved exercise technique, movement at target joints, use of target muscles after min to mod verbal, visual, tactile cues. POC, Therapy orientation, rehab goals and expectations  Person educated: Patient and Spouse Education method: Explanation and Demonstration Education comprehension: verbalized understanding  HOME EXERCISE PROGRAM: Access Code: [email or web address] URL: [email or web address] Date: 06/03/2023 Prepared by: Grier Rocher  Exercises - Seated Reaching to Side and Across Body  - 1 x daily - 7 x weekly - 3 sets - 10 reps - Seated Reach Forward, Up, and To Sides  - 1 x daily - 7 x weekly - 3 sets - 10 reps -  Standing Reach to Opposite Side with Weight Shift  - 1 x daily - 7 x weekly - 3 sets - 10 reps - Step Forward with Arms Reaching to Sides  - 1 x daily - 7 x weekly - 3 sets - 10 reps - Sit to Stand with Arm Reach and Jump  - 1 x daily - 7 x weekly - 3 sets - 10 reps  Access Code: [email or web address] URL: [email or web address] Date: 04/20/2023 Prepared by: Grier Rocher  Exercises - Standing Hip Extension with Resistance at Ankles and Counter Support  - 1 x daily - 5 x weekly - 3 sets - 10 reps - Standing Hip Abduction with Resistance at Ankles and Counter Support  - 1 x daily - 5 x weekly - 3 sets - 10 reps - Marching with Resistance  - 1 x daily - 5 x weekly - 3 sets - 10 reps   SHORT TERM GOALS: Target date: 07/07/2023    Patient will be independent in home exercise program to improve strength/mobility for better functional independence with ADLs. Baseline: given on 7/9 Goal status: IN PROGRESS   LONG TERM GOALS: Target date: 07/07/2023   Patient will increase FOTO score to equal to or greater than  59  to demonstrate statistically significant improvement in mobility and quality of life.  Baseline: 55 8/13: 48  Goal status: IN PROGRESS   2.  Patient (> 67 years old) will complete five times sit to stand test in < 15 seconds indicating an increased LE strength and improved balance. Baseline: 16.47 sec  8/13 15.07sec  Goal status: IN PROGRESS  3.  Patient will increase Berg Balance score by > 6 points to demonstrate decreased fall risk during functional activities Baseline: 47/56 8/13:50 Goal status: IN PROGRESS  4.  Patient will increase 10 meter walk test to >1.83m/s as to improve gait speed for better community ambulation and to reduce fall risk. Baseline: 0.27m/s  8/13: normal 0.56m/s. Fast: 1.25m/s Goal status: IN PROGRESS  5.  Patient will reduce timed up and go to <11 seconds to reduce fall risk and demonstrate improved transfer/gait ability. Baseline:  13.37 8/13: 11.42 sec  Goal status: IN PROGRESS  6.  Patient will increase dynamic gait index score to >19/24 as to demonstrate reduced fall risk and improved dynamic gait balance for better safety with community/home ambulation.   Baseline: 19/24 8/13: 21/24 Goal status: MET  ASSESSMENT:  CLINICAL IMPRESSION:  Pt continues to benefit from strengthening exercises and noted a reduction in overall knee pain following the conclusion of the therapy session.  Pt also able to recall exercises better during the session today, yet still requires mirroring and verbal cues for reminders on proper technique.  Pt participated in LE strengthening exercises that the pt deems as the reason for his pain levels declining.  Pt will continue to benefit from continued strength training in order to improve overall balance as well as reduce pain in the knee.   Pt will continue to benefit from skilled therapy to address remaining deficits in order to improve overall QoL and return to PLOF.     OBJECTIVE IMPAIRMENTS: Abnormal gait, cardiopulmonary status limiting activity, decreased activity tolerance, decreased balance, decreased cognition, decreased coordination, decreased endurance, decreased knowledge of condition, decreased mobility, difficulty walking, decreased ROM, decreased strength, decreased safety awareness, impaired flexibility, impaired UE functional use, and improper body mechanics.   ACTIVITY LIMITATIONS: carrying, standing, and locomotion level  PARTICIPATION LIMITATIONS: meal prep, cleaning, laundry, driving, community activity, yard work, and school  PERSONAL FACTORS: Age, Behavior pattern, Education, Fitness, and Time since onset of injury/illness/exacerbation are also affecting patient's functional outcome.   REHAB POTENTIAL: Good  CLINICAL DECISION MAKING: Stable/uncomplicated  EVALUATION COMPLEXITY: Moderate  PLAN:  PT FREQUENCY: 1-2x/week  PT DURATION: 12 weeks  PLANNED  INTERVENTIONS: Therapeutic exercises, Therapeutic activity, Neuromuscular re-education, Balance training, Gait training, Patient/Family education, Self Care, Joint mobilization, Stair training, DME instructions, Cognitive remediation, Cryotherapy, Moist heat, Manual therapy, and Re-evaluation  PLAN FOR NEXT SESSION:   Large amplitude movement.  Endurance training.  Standing PWR!    Nolon Bussing, PT, DPT Physical Therapist - [facility]  06/16/23, 4:18 PM

## 2023-06-21 ENCOUNTER — Ambulatory Visit: Payer: Medicare Other | Admitting: Physical Therapy

## 2023-06-21 DIAGNOSIS — M6281 Muscle weakness (generalized): Secondary | ICD-10-CM

## 2023-06-21 DIAGNOSIS — R2689 Other abnormalities of gait and mobility: Secondary | ICD-10-CM

## 2023-06-21 DIAGNOSIS — R278 Other lack of coordination: Secondary | ICD-10-CM

## 2023-06-21 NOTE — Therapy (Signed)
OUTPATIENT PHYSICAL THERAPY NEURO TREATMENT/    Patient Name: Carlos Morrison MRN: [id number] DOB:12-May-1941, 82 y.o., male Today's Date: 06/21/2023   PCP: Carlos Reichmann, MD  REFERRING PROVIDER: Barbette Reichmann, MD   END OF SESSION:  PT End of Session - 06/21/23 1128     Visit Number 17    Number of Visits 24    Date for PT Re-Evaluation 07/07/23    Progress Note Due on Visit 20    PT Start Time 1104    PT Stop Time 1145    PT Time Calculation (min) 41 min    Equipment Utilized During Treatment Gait belt    Activity Tolerance Patient tolerated treatment well;No increased pain    Behavior During Therapy WFL for tasks assessed/performed                Past Medical History:  Diagnosis Date   Arthritis    Bilateral knees   GERD (gastroesophageal reflux disease)    Hyperlipidemia    Past Surgical History:  Procedure Laterality Date   CATARACT EXTRACTION W/PHACO Left 03/30/2018   Procedure: CATARACT EXTRACTION PHACO AND INTRAOCULAR LENS PLACEMENT (IOC)  LEFT;  Surgeon: Carlos Mola, MD;  Location: [facility];  Service: Ophthalmology;  Laterality: Left;   CATARACT EXTRACTION W/PHACO Right 01/29/2021   Procedure: CATARACT EXTRACTION PHACO AND INTRAOCULAR LENS PLACEMENT (IOC) RIGHT;  Surgeon: Carlos Mola, MD;  Location: [facility];  Service: Ophthalmology;  Laterality: Right;  3.24 1:03.0 5.18%   COLONOSCOPY  2011   Dr. Kizzie Morrison   COLONOSCOPY N/A 05/01/2015   Procedure: COLONOSCOPY;  Surgeon: Carlos Mayotte, MD;  Location: [facility];  Service: Endoscopy;  Laterality: N/A;   GANGLION CYST EXCISION Left 08/2006   Patient Active Problem List   Diagnosis Date Noted   CN (constipation) 03/19/2015    ONSET DATE: fall 2 weeks ago.    REFERRING DIAG:  R26.89 (ICD-10-CM) - Other abnormalities of gait and mobility  Z91.81 (ICD-10-CM) - History of falling    THERAPY DIAG:  Muscle weakness (generalized)  Other abnormalities of  gait and mobility  Other lack of coordination  Rationale for Evaluation and Treatment: Rehabilitation  SUBJECTIVE:                                                                                                                                                                                             SUBJECTIVE STATEMENT:  Pt reports that he is doing well. Was able to watch football over the weekend. No report of pain at start of PT treatment.    Pt accompanied by: significant other Carlos Morrison  PERTINENT HISTORY: recent fall.   PAIN:  Are you having pain? No  PRECAUTIONS: None  WEIGHT BEARING RESTRICTIONS: No  FALLS: Has patient fallen in last 6 months? Yes. Number of falls 1  LIVING ENVIRONMENT: Lives with: lives with their spouse Lives in: House/apartment Stairs: Yes: External: 4 steps; on left going up Has following equipment at home: None  PLOF: Independent and Independent with basic ADLs  PATIENT GOALS: improve balance. Reduce fall risk. Per Wife: improve Arm stretch    OBJECTIVE:   DIAGNOSTIC FINDINGS: EXAM: MRI HEAD WITHOUT CONTRAST  IMPRESSION: No evidence of acute intracranial abnormality.   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   Mild generalized cerebral and cerebellar atrophy.   Mild paranasal sinus disease, as described.   Trace fluid within the bilateral mastoid air cells.    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   EDEMA:  WFL  MUSCLE TONE: WFL   MUSCLE LENGTH: WFL  DTRs:  Patella 2+ = Normal  POSTURE: rounded shoulders, forward head, and flexed trunk   LOWER EXTREMITY ROM:     Active  Right Eval Left Eval  Hip flexion [facility]  Hip extension    Hip abduction [facility]  Hip adduction [facility]  Knee flexion  [facility] WFL  Knee extension WFL Lacking 5 deg full extnesion   Ankle dorsiflexion WFL WFL   (Blank rows = not tested)  LOWER EXTREMITY MMT:    MMT Right Eval Left Eval  Hip flexion 4+ 4+  Hip extension    Hip abduction 4+ 4+  Hip adduction 5 5  Knee flexion 4+ 4+  Knee extension 5 5  Ankle dorsiflexion 4+ 4+  (Blank rows = not tested)  BED MOBILITY:  Sit to supine Complete Independence Supine to sit Complete Independence  TRANSFERS: Assistive device utilized: None  Sit to stand: Complete Independence Stand to sit: Complete Independence Chair to chair: Complete Independence Floor:  to be assessed   RAMP:  Level of Assistance: CGA Assistive device utilized:  none   CURB:  Level of Assistance: CGA Assistive device utilized: None   STAIRS: Level of Assistance: SBA Stair Negotiation Technique: Step to Pattern with Bilateral Rails Number of Stairs: 4  Height of Stairs: 6  Comments:  1 rail with step to ascent, 2 rails with step through descent   GAIT: Gait pattern: step through pattern, decreased arm swing- Right, decreased arm swing- Left, Left foot flat, shuffling, and narrow BOS Distance walked: 1257 in 6 min walk  Assistive device utilized: None Level of assistance: Complete Independence Comments: increased shuffle/festination as well as tremor in LUE/LLE with fatigue   FUNCTIONAL TESTS:  5 times sit to stand: 16.47sec  Timed up and go (TUG): 13.37sec  6 minute walk test: 1257' with no AD 10 meter walk test: 10.67sec(0.13m/s)  Berg Balance Scale: 47 Dynamic Gait Index: 19 Functional gait assessment: 21  PATIENT SURVEYS:  FOTO 55  TODAY'S TREATMENT: DATE: 06/21/2023     NUstep level 1-4 x 6 min cues for full ROM throughout   Seated PWR! :  up x 12 rock x 12  Twist x 10  Step x 10   Standing  PWR up x 10  Step x 12   Sit<>stand to forward toss ball off wall x  12  Sit<>stand with lateral toss ball off walk x 10 bil   Weighted gait with 5#  ankle weights x 661ft with supervision assist. Attmepted weighted stair management, but unable to perform recirpocal pattern up/down steps due to pain in the L knee. P  Constant visual and verbal feed back for improved ROM and timing of PWR! Movements to improve proper muscle activation.     PATIENT EDUCATION: Education details: Pt educated throughout session about proper posture and technique with exercises. Improved exercise technique, movement at target joints, use of target muscles after min to mod verbal, visual, tactile cues. POC, Therapy orientation, rehab goals and expectations  Person educated: Patient and Spouse Education method: Explanation and Demonstration Education comprehension: verbalized understanding  HOME EXERCISE PROGRAM: Access Code: [email or web address] URL: [email or web address] Date: 06/03/2023 Prepared by: Grier Rocher  Exercises - Seated Reaching to Side and Across Body  - 1 x daily - 7 x weekly - 3 sets - 10 reps - Seated Reach Forward, Up, and To Sides  - 1 x daily - 7 x weekly - 3 sets - 10 reps - Standing Reach to Opposite Side with Weight Shift  - 1 x daily - 7 x weekly - 3 sets - 10 reps - Step Forward with Arms Reaching to Sides  - 1 x daily - 7 x weekly - 3 sets - 10 reps - Sit to Stand with Arm Reach and Jump  - 1 x daily - 7 x weekly - 3 sets - 10 reps  Access Code: [email or web address] URL: [email or web address] Date: 04/20/2023 Prepared by: Grier Rocher  Exercises - Standing Hip Extension with Resistance at Ankles and Counter Support  - 1 x daily - 5 x weekly - 3 sets - 10 reps - Standing Hip Abduction with Resistance at Ankles and Counter Support  - 1 x daily - 5 x weekly - 3 sets - 10 reps - Marching with Resistance  - 1 x daily - 5 x weekly - 3 sets - 10 reps   SHORT TERM GOALS: Target date: 07/07/2023    Patient will be independent in home exercise program to improve strength/mobility for better functional independence with  ADLs. Baseline: given on 7/9 Goal status: IN PROGRESS   LONG TERM GOALS: Target date: 07/07/2023   Patient will increase FOTO score to equal to or greater than  59  to demonstrate statistically significant improvement in mobility and quality of life.  Baseline: 55 8/13: 48  Goal status: IN PROGRESS   2.  Patient (> 56 years old) will complete five times sit to stand test in < 15 seconds indicating an increased LE strength and improved balance. Baseline: 16.47 sec  8/13 15.07sec  Goal status: IN PROGRESS  3.  Patient will increase Berg Balance score by > 6 points to demonstrate decreased fall risk during functional activities Baseline: 47/56 8/13:50 Goal status: IN PROGRESS  4.  Patient will increase 10 meter walk test to >1.54m/s as to improve gait speed for better community ambulation and to reduce fall risk. Baseline: 0.9m/s  8/13: normal 0.79m/s. Fast: 1.4m/s Goal status: IN PROGRESS  5.  Patient will reduce timed up and go to <11 seconds to reduce fall risk and demonstrate improved transfer/gait ability. Baseline: 13.37 8/13: 11.42 sec  Goal status: IN PROGRESS  6.  Patient will increase dynamic gait index score to >19/24 as to demonstrate reduced fall risk and improved dynamic gait balance for better safety with community/home ambulation.   Baseline: 19/24 8/13: 21/24  Goal status: MET  ASSESSMENT:  CLINICAL IMPRESSION:  Pt continues to put forth excellent effort to address PD deficits. PT treatment focused on improved rotation and postural exercises as well as weighted gait to improve neuromotor recruitment with gait. Constant verbal and visual feedback from PT to maintain proper form throughout session.   Pt will continue to benefit from skilled therapy to address remaining deficits in order to improve overall QoL and return to PLOF.     OBJECTIVE IMPAIRMENTS: Abnormal gait, cardiopulmonary status limiting activity, decreased activity tolerance, decreased balance,  decreased cognition, decreased coordination, decreased endurance, decreased knowledge of condition, decreased mobility, difficulty walking, decreased ROM, decreased strength, decreased safety awareness, impaired flexibility, impaired UE functional use, and improper body mechanics.   ACTIVITY LIMITATIONS: carrying, standing, and locomotion level  PARTICIPATION LIMITATIONS: meal prep, cleaning, laundry, driving, community activity, yard work, and school  PERSONAL FACTORS: Age, Behavior pattern, Education, Fitness, and Time since onset of injury/illness/exacerbation are also affecting patient's functional outcome.   REHAB POTENTIAL: Good  CLINICAL DECISION MAKING: Stable/uncomplicated  EVALUATION COMPLEXITY: Moderate  PLAN:  PT FREQUENCY: 1-2x/week  PT DURATION: 12 weeks  PLANNED INTERVENTIONS: Therapeutic exercises, Therapeutic activity, Neuromuscular re-education, Balance training, Gait training, Patient/Family education, Self Care, Joint mobilization, Stair training, DME instructions, Cognitive remediation, Cryotherapy, Moist heat, Manual therapy, and Re-evaluation  PLAN FOR NEXT SESSION:   Large amplitude movement.  Endurance training.  Standing PWR!   Grier Rocher PT, DPT  Physical Therapist - [place]  [facility]  1:24 PM 06/21/23

## 2023-06-24 ENCOUNTER — Ambulatory Visit: Payer: Medicare Other | Admitting: Physical Therapy

## 2023-06-29 ENCOUNTER — Ambulatory Visit: Payer: Medicare Other | Admitting: Physical Therapy

## 2023-06-29 DIAGNOSIS — M6281 Muscle weakness (generalized): Secondary | ICD-10-CM | POA: Diagnosis not present

## 2023-06-29 DIAGNOSIS — R2689 Other abnormalities of gait and mobility: Secondary | ICD-10-CM

## 2023-06-29 DIAGNOSIS — R278 Other lack of coordination: Secondary | ICD-10-CM

## 2023-06-29 NOTE — Therapy (Signed)
OUTPATIENT PHYSICAL THERAPY NEURO TREATMENT/    Patient Name: Carlos Morrison MRN: [id number] DOB:[birth year], 82 y.o., male Today's Date: 06/29/2023   PCP: Barbette Reichmann, MD  REFERRING PROVIDER: Barbette Reichmann, MD   END OF SESSION:  PT End of Session - 06/29/23 1117     Visit Number 18    Number of Visits 24    Date for PT Re-Evaluation 07/07/23    Progress Note Due on Visit 20    PT Start Time 1100    PT Stop Time 1140    PT Time Calculation (min) 40 min    Equipment Utilized During Treatment Gait belt    Activity Tolerance Patient tolerated treatment well;No increased pain    Behavior During Therapy WFL for tasks assessed/performed                Past Medical History:  Diagnosis Date   Arthritis    Bilateral knees   GERD (gastroesophageal reflux disease)    Hyperlipidemia    Past Surgical History:  Procedure Laterality Date   CATARACT EXTRACTION W/PHACO Left 03/30/2018   Procedure: CATARACT EXTRACTION PHACO AND INTRAOCULAR LENS PLACEMENT (IOC)  LEFT;  Surgeon: Lockie Mola, MD;  Location: [facility];  Service: Ophthalmology;  Laterality: Left;   CATARACT EXTRACTION W/PHACO Right 01/29/2021   Procedure: CATARACT EXTRACTION PHACO AND INTRAOCULAR LENS PLACEMENT (IOC) RIGHT;  Surgeon: Lockie Mola, MD;  Location: [facility];  Service: Ophthalmology;  Laterality: Right;  3.24 1:03.0 5.18%   COLONOSCOPY  2011   Dr. Kizzie Ide   COLONOSCOPY N/A 05/01/2015   Procedure: COLONOSCOPY;  Surgeon: Earline Mayotte, MD;  Location: [facility];  Service: Endoscopy;  Laterality: N/A;   GANGLION CYST EXCISION Left 08/2006   Patient Active Problem List   Diagnosis Date Noted   CN (constipation) 03/19/2015    ONSET DATE: fall 2 weeks ago.    REFERRING DIAG:  R26.89 (ICD-10-CM) - Other abnormalities of gait and mobility  Z91.81 (ICD-10-CM) - History of falling    THERAPY DIAG:  Muscle weakness (generalized)  Other abnormalities  of gait and mobility  Other lack of coordination  Rationale for Evaluation and Treatment: Rehabilitation  SUBJECTIVE:                                                                                                                                                                                             SUBJECTIVE STATEMENT:  Pt reports that he is doing well. States that he has been practicing HEP at home.  No other new updates.    Pt accompanied by: significant other   PERTINENT HISTORY: recent fall.  PAIN:  Are you having pain? No  PRECAUTIONS: None  WEIGHT BEARING RESTRICTIONS: No  FALLS: Has patient fallen in last 6 months? Yes. Number of falls 1  LIVING ENVIRONMENT: Lives with: lives with their spouse Lives in: House/apartment Stairs: Yes: External: 4 steps; on left going up Has following equipment at home: None  PLOF: Independent and Independent with basic ADLs  PATIENT GOALS: improve balance. Reduce fall risk. Per Wife: improve Arm stretch    OBJECTIVE:   DIAGNOSTIC FINDINGS: EXAM: MRI HEAD WITHOUT CONTRAST  IMPRESSION: No evidence of acute intracranial abnormality.   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   Mild generalized cerebral and cerebellar atrophy.   Mild paranasal sinus disease, as described.   Trace fluid within the bilateral mastoid air cells.    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   EDEMA:  WFL  MUSCLE TONE: WFL   MUSCLE LENGTH: WFL  DTRs:  Patella 2+ = Normal  POSTURE: rounded shoulders, forward head, and flexed trunk   LOWER EXTREMITY ROM:     Active  Right Eval Left Eval  Hip flexion [facility]  Hip extension    Hip abduction [facility]  Hip adduction [facility]  Knee flexion [facility] WFL  Knee extension  WFL Lacking 5 deg full extnesion   Ankle dorsiflexion WFL WFL   (Blank rows = not tested)  LOWER EXTREMITY MMT:    MMT Right Eval Left Eval  Hip flexion 4+ 4+  Hip extension    Hip abduction 4+ 4+  Hip adduction 5 5  Knee flexion 4+ 4+  Knee extension 5 5  Ankle dorsiflexion 4+ 4+  (Blank rows = not tested)  BED MOBILITY:  Sit to supine Complete Independence Supine to sit Complete Independence  TRANSFERS: Assistive device utilized: None  Sit to stand: Complete Independence Stand to sit: Complete Independence Chair to chair: Complete Independence Floor:  to be assessed   RAMP:  Level of Assistance: CGA Assistive device utilized:  none   CURB:  Level of Assistance: CGA Assistive device utilized: None   STAIRS: Level of Assistance: SBA Stair Negotiation Technique: Step to Pattern with Bilateral Rails Number of Stairs: 4  Height of Stairs: 6  Comments:  1 rail with step to ascent, 2 rails with step through descent   GAIT: Gait pattern: step through pattern, decreased arm swing- Right, decreased arm swing- Left, Left foot flat, shuffling, and narrow BOS Distance walked: 1257 in 6 min walk  Assistive device utilized: None Level of assistance: Complete Independence Comments: increased shuffle/festination as well as tremor in LUE/LLE with fatigue   FUNCTIONAL TESTS:  5 times sit to stand: 16.47sec  Timed up and go (TUG): 13.37sec  6 minute walk test: 1257' with no AD 10 meter walk test: 10.67sec(0.91m/s)  Berg Balance Scale: 47 Dynamic Gait Index: 19 Functional gait assessment: 21  PATIENT SURVEYS:  FOTO 55  TODAY'S TREATMENT: DATE: 06/29/2023     Octane x 5 min BLE/BUE reciprocal movement training and neurologic priming.   Standing therex:  Squat with UE support on rail x 12  Standing calf stretch on 15 dge wedge x 30 sec Cal raise from wedge x 12  Reciprocal march x 12 BLE   Seated PWR twist x 12 .  Seated PWT  step x 6 bil  Seated trunk  rotation to hit stationary ball with racket x 12 bil  Seated trunk rotation to hit moving ball with racket. X 12  Standing rock x 12 bil  Sit<>stand PWR up.  Standing ball hit with racket x 12 bil  Visual and verbal feedback for improved full trunk rotation throughout session as well as posture. PT treatment focused on rotational movements on this day to improve activation of deep core and postural muscles with emphasis on large amplitude movements in rotation patterns.     PATIENT EDUCATION: Education details: Pt educated throughout session about proper posture and technique with exercises. Improved exercise technique, movement at target joints, use of target muscles after min to mod verbal, visual, tactile cues. POC, Therapy orientation, rehab goals and expectations  Person educated: Patient and Spouse Education method: Explanation and Demonstration Education comprehension: verbalized understanding  HOME EXERCISE PROGRAM: Access Code: [email or web address] URL: [email or web address] Date: 06/03/2023 Prepared by: Grier Rocher  Exercises - Seated Reaching to Side and Across Body  - 1 x daily - 7 x weekly - 3 sets - 10 reps - Seated Reach Forward, Up, and To Sides  - 1 x daily - 7 x weekly - 3 sets - 10 reps - Standing Reach to Opposite Side with Weight Shift  - 1 x daily - 7 x weekly - 3 sets - 10 reps - Step Forward with Arms Reaching to Sides  - 1 x daily - 7 x weekly - 3 sets - 10 reps - Sit to Stand with Arm Reach and Jump  - 1 x daily - 7 x weekly - 3 sets - 10 reps  Access Code: [email or web address] URL: [email or web address] Date: 04/20/2023 Prepared by: Grier Rocher  Exercises - Standing Hip Extension with Resistance at Ankles and Counter Support  - 1 x daily - 5 x weekly - 3 sets - 10 reps - Standing Hip Abduction with Resistance at Ankles and Counter Support  - 1 x daily - 5 x weekly - 3 sets - 10 reps - Marching with Resistance  - 1 x daily - 5 x weekly - 3 sets  - 10 reps   SHORT TERM GOALS: Target date: 07/07/2023    Patient will be independent in home exercise program to improve strength/mobility for better functional independence with ADLs. Baseline: given on 7/9 Goal status: IN PROGRESS   LONG TERM GOALS: Target date: 07/07/2023   Patient will increase FOTO score to equal to or greater than  59  to demonstrate statistically significant improvement in mobility and quality of life.  Baseline: 55 8/13: 48  Goal status: IN PROGRESS   2.  Patient (> 23 years old) will complete five times sit to stand test in < 15 seconds indicating an increased LE strength and improved balance. Baseline: 16.47 sec  8/13 15.07sec  Goal status: IN PROGRESS  3.  Patient will increase Berg Balance score by > 6 points to demonstrate decreased fall risk during functional activities Baseline: 47/56 8/13:50 Goal status: IN PROGRESS  4.  Patient will increase 10 meter walk test to >1.3m/s as to improve gait speed for better community ambulation and to reduce fall risk. Baseline: 0.62m/s  8/13: normal 0.32m/s. Fast: 1.4m/s Goal status: IN PROGRESS  5.  Patient will reduce timed up and go to <11 seconds to reduce fall risk and demonstrate improved transfer/gait ability. Baseline: 13.37 8/13: 11.42 sec  Goal status: IN PROGRESS  6.  Patient will increase dynamic gait index  score to >19/24 as to demonstrate reduced fall risk and improved dynamic gait balance for better safety with community/home ambulation.   Baseline: 19/24 8/13: 21/24 Goal status: MET  ASSESSMENT:  CLINICAL IMPRESSION:  Pt continues to put forth excellent effort to address PD deficits. PT treatment focused on improved rotation and postural exercises. Noted to have improved posture and full ROM rotation with HEP twist movements. No LOB with dynamic movements throughout session.  Pt will continue to benefit from skilled therapy to address remaining deficits in order to improve overall QoL  and return to PLOF.     OBJECTIVE IMPAIRMENTS: Abnormal gait, cardiopulmonary status limiting activity, decreased activity tolerance, decreased balance, decreased cognition, decreased coordination, decreased endurance, decreased knowledge of condition, decreased mobility, difficulty walking, decreased ROM, decreased strength, decreased safety awareness, impaired flexibility, impaired UE functional use, and improper body mechanics.   ACTIVITY LIMITATIONS: carrying, standing, and locomotion level  PARTICIPATION LIMITATIONS: meal prep, cleaning, laundry, driving, community activity, yard work, and school  PERSONAL FACTORS: Age, Behavior pattern, Education, Fitness, and Time since onset of injury/illness/exacerbation are also affecting patient's functional outcome.   REHAB POTENTIAL: Good  CLINICAL DECISION MAKING: Stable/uncomplicated  EVALUATION COMPLEXITY: Moderate  PLAN:  PT FREQUENCY: 1-2x/week  PT DURATION: 12 weeks  PLANNED INTERVENTIONS: Therapeutic exercises, Therapeutic activity, Neuromuscular re-education, Balance training, Gait training, Patient/Family education, Self Care, Joint mobilization, Stair training, DME instructions, Cognitive remediation, Cryotherapy, Moist heat, Manual therapy, and Re-evaluation  PLAN FOR NEXT SESSION:   Large amplitude movement.  Endurance training.  Standing PWR!   Grier Rocher PT, DPT  Physical Therapist - [facility]  11:27 AM 06/29/23

## 2023-07-01 ENCOUNTER — Encounter: Payer: Self-pay | Admitting: Physical Therapy

## 2023-07-01 ENCOUNTER — Ambulatory Visit: Payer: Medicare Other

## 2023-07-01 DIAGNOSIS — M6281 Muscle weakness (generalized): Secondary | ICD-10-CM | POA: Diagnosis not present

## 2023-07-01 DIAGNOSIS — R278 Other lack of coordination: Secondary | ICD-10-CM

## 2023-07-01 DIAGNOSIS — R2689 Other abnormalities of gait and mobility: Secondary | ICD-10-CM

## 2023-07-01 NOTE — Therapy (Signed)
OUTPATIENT PHYSICAL THERAPY NEURO TREATMENT/    Patient Name: Carlos Morrison MRN: [id number] DOB:[birth year], 82 y.o., male Today's Date: 07/01/2023   PCP: Barbette Reichmann, MD  REFERRING PROVIDER: Barbette Reichmann, MD   END OF SESSION:  PT End of Session - 07/01/23 1016     Visit Number 19    Number of Visits 24    Date for PT Re-Evaluation 07/07/23    Progress Note Due on Visit 20    PT Start Time 1015    PT Stop Time 1056    PT Time Calculation (min) 41 min    Equipment Utilized During Treatment Gait belt    Activity Tolerance Patient tolerated treatment well;No increased pain    Behavior During Therapy WFL for tasks assessed/performed                 Past Medical History:  Diagnosis Date   Arthritis    Bilateral knees   GERD (gastroesophageal reflux disease)    Hyperlipidemia    Past Surgical History:  Procedure Laterality Date   CATARACT EXTRACTION W/PHACO Left 03/30/2018   Procedure: CATARACT EXTRACTION PHACO AND INTRAOCULAR LENS PLACEMENT (IOC)  LEFT;  Surgeon: Lockie Mola, MD;  Location: [facility];  Service: Ophthalmology;  Laterality: Left;   CATARACT EXTRACTION W/PHACO Right 01/29/2021   Procedure: CATARACT EXTRACTION PHACO AND INTRAOCULAR LENS PLACEMENT (IOC) RIGHT;  Surgeon: Lockie Mola, MD;  Location: [facility];  Service: Ophthalmology;  Laterality: Right;  3.24 1:03.0 5.18%   COLONOSCOPY  2011   Dr. Kizzie Ide   COLONOSCOPY N/A 05/01/2015   Procedure: COLONOSCOPY;  Surgeon: Earline Mayotte, MD;  Location: [facility];  Service: Endoscopy;  Laterality: N/A;   GANGLION CYST EXCISION Left 08/2006   Patient Active Problem List   Diagnosis Date Noted   CN (constipation) 03/19/2015    ONSET DATE: fall 2 weeks ago.    REFERRING DIAG:  R26.89 (ICD-10-CM) - Other abnormalities of gait and mobility  Z91.81 (ICD-10-CM) - History of falling    THERAPY DIAG:  Muscle weakness (generalized)  Other abnormalities  of gait and mobility  Other lack of coordination  Rationale for Evaluation and Treatment: Rehabilitation  SUBJECTIVE:                                                                                                                                                                                             SUBJECTIVE STATEMENT:   No new updates since last visit. Still having trouble with L knee    Pt accompanied by: significant other   PERTINENT HISTORY: recent fall.   PAIN:  Are you having  pain? No  PRECAUTIONS: None  WEIGHT BEARING RESTRICTIONS: No  FALLS: Has patient fallen in last 6 months? Yes. Number of falls 1  LIVING ENVIRONMENT: Lives with: lives with their spouse Lives in: House/apartment Stairs: Yes: External: 4 steps; on left going up Has following equipment at home: None  PLOF: Independent and Independent with basic ADLs  PATIENT GOALS: improve balance. Reduce fall risk. Per Wife: improve Arm stretch    OBJECTIVE:   DIAGNOSTIC FINDINGS: EXAM: MRI HEAD WITHOUT CONTRAST  IMPRESSION: No evidence of acute intracranial abnormality.   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   Mild generalized cerebral and cerebellar atrophy.   Mild paranasal sinus disease, as described.   Trace fluid within the bilateral mastoid air cells.    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   EDEMA:  WFL  MUSCLE TONE: WFL   MUSCLE LENGTH: WFL  DTRs:  Patella 2+ = Normal  POSTURE: rounded shoulders, forward head, and flexed trunk   LOWER EXTREMITY ROM:     Active  Right Eval Left Eval  Hip flexion [facility]  Hip extension    Hip abduction [facility]  Hip adduction [facility]  Knee flexion [facility] WFL  Knee extension WFL Lacking 5 deg full extnesion    Ankle dorsiflexion WFL WFL   (Blank rows = not tested)  LOWER EXTREMITY MMT:    MMT Right Eval Left Eval  Hip flexion 4+ 4+  Hip extension    Hip abduction 4+ 4+  Hip adduction 5 5  Knee flexion 4+ 4+  Knee extension 5 5  Ankle dorsiflexion 4+ 4+  (Blank rows = not tested)  BED MOBILITY:  Sit to supine Complete Independence Supine to sit Complete Independence  TRANSFERS: Assistive device utilized: None  Sit to stand: Complete Independence Stand to sit: Complete Independence Chair to chair: Complete Independence Floor:  to be assessed   RAMP:  Level of Assistance: CGA Assistive device utilized:  none   CURB:  Level of Assistance: CGA Assistive device utilized: None   STAIRS: Level of Assistance: SBA Stair Negotiation Technique: Step to Pattern with Bilateral Rails Number of Stairs: 4  Height of Stairs: 6  Comments:  1 rail with step to ascent, 2 rails with step through descent   GAIT: Gait pattern: step through pattern, decreased arm swing- Right, decreased arm swing- Left, Left foot flat, shuffling, and narrow BOS Distance walked: 1257 in 6 min walk  Assistive device utilized: None Level of assistance: Complete Independence Comments: increased shuffle/festination as well as tremor in LUE/LLE with fatigue   FUNCTIONAL TESTS:  5 times sit to stand: 16.47sec  Timed up and go (TUG): 13.37sec  6 minute walk test: 1257' with no AD 10 meter walk test: 10.67sec(0.63m/s)  Berg Balance Scale: 47 Dynamic Gait Index: 19 Functional gait assessment: 21  PATIENT SURVEYS:  FOTO 55  TODAY'S TREATMENT: DATE: 07/01/2023     Octane x 5 min level 2 BLE/BUE reciprocal movement training and neurologic priming.   Standing therex:  Squat with UE support on rail x 12  Standing calf stretch on 15 dge wedge x 30 sec Calf raise from wedge x 12  Reciprocal march x 12 BLE    Seated large amplitude twist x 12 .  Standing large amplitude step  with B shoulder horizontal  abduction x 10 bil  Seated trunk rotation to hit stationary ball with racket x 12 bil  Seated trunk rotation to hit moving ball with racket. X 12  Sit<>stand x 10 with B shoulder horizontal abduction Forward ambulation with 3# AW x 300'  Lateral walking with 3# AW in hallway   Standing marching with 3# AW x 10 each LE  Stair negotiation with 3# AW with B handrails and alternating pattern 3 x 4 stairs  Visual and verbal feedback for improved full trunk rotation throughout session as well as posture. PT treatment focused on rotational movements on this day to improve activation of deep core and postural muscles with emphasis on large amplitude movements in rotation patterns.     PATIENT EDUCATION: Education details: Pt educated throughout session about proper posture and technique with exercises. Improved exercise technique, movement at target joints, use of target muscles after min to mod verbal, visual, tactile cues. POC, Therapy orientation, rehab goals and expectations  Person educated: Patient and Spouse Education method: Explanation and Demonstration Education comprehension: verbalized understanding  HOME EXERCISE PROGRAM: Access Code: [email or web address] URL: [email or web address] Date: 06/03/2023 Prepared by: Grier Rocher  Exercises - Seated Reaching to Side and Across Body  - 1 x daily - 7 x weekly - 3 sets - 10 reps - Seated Reach Forward, Up, and To Sides  - 1 x daily - 7 x weekly - 3 sets - 10 reps - Standing Reach to Opposite Side with Weight Shift  - 1 x daily - 7 x weekly - 3 sets - 10 reps - Step Forward with Arms Reaching to Sides  - 1 x daily - 7 x weekly - 3 sets - 10 reps - Sit to Stand with Arm Reach and Jump  - 1 x daily - 7 x weekly - 3 sets - 10 reps  Access Code: [email or web address] URL: [email or web address] Date: 04/20/2023 Prepared by: Grier Rocher  Exercises - Standing Hip Extension with Resistance at Ankles and Counter Support  - 1 x daily  - 5 x weekly - 3 sets - 10 reps - Standing Hip Abduction with Resistance at Ankles and Counter Support  - 1 x daily - 5 x weekly - 3 sets - 10 reps - Marching with Resistance  - 1 x daily - 5 x weekly - 3 sets - 10 reps   SHORT TERM GOALS: Target date: 07/07/2023    Patient will be independent in home exercise program to improve strength/mobility for better functional independence with ADLs. Baseline: given on 7/9 Goal status: IN PROGRESS   LONG TERM GOALS: Target date: 07/07/2023   Patient will increase FOTO score to equal to or greater than  59  to demonstrate statistically significant improvement in mobility and quality of life.  Baseline: 55 8/13: 48  Goal status: IN PROGRESS   2.  Patient (> 52 years old) will complete five times sit to stand test in < 15 seconds indicating an increased LE strength and improved balance. Baseline: 16.47 sec  8/13 15.07sec  Goal status: IN PROGRESS  3.  Patient will increase Berg Balance score by > 6 points to demonstrate decreased fall risk during functional activities Baseline: 47/56 8/13:50 Goal status: IN PROGRESS  4.  Patient will increase 10 meter walk test to >1.66m/s as to improve gait speed for better community ambulation and to reduce fall risk. Baseline: 0.26m/s  8/13: normal 0.32m/s. Fast: 1.57m/s Goal status: IN PROGRESS  5.  Patient will  reduce timed up and go to <11 seconds to reduce fall risk and demonstrate improved transfer/gait ability. Baseline: 13.37 8/13: 11.42 sec  Goal status: IN PROGRESS  6.  Patient will increase dynamic gait index score to >19/24 as to demonstrate reduced fall risk and improved dynamic gait balance for better safety with community/home ambulation.   Baseline: 19/24 8/13: 21/24 Goal status: MET  ASSESSMENT:  CLINICAL IMPRESSION:   Patient arrives to treatment session motivated to participate. Session focused on large amplitude movements as well as strengthening. Tolerated treatment session  well this date. Pt will continue to benefit from skilled therapy to address remaining deficits in order to improve overall QoL and return to PLOF.     OBJECTIVE IMPAIRMENTS: Abnormal gait, cardiopulmonary status limiting activity, decreased activity tolerance, decreased balance, decreased cognition, decreased coordination, decreased endurance, decreased knowledge of condition, decreased mobility, difficulty walking, decreased ROM, decreased strength, decreased safety awareness, impaired flexibility, impaired UE functional use, and improper body mechanics.   ACTIVITY LIMITATIONS: carrying, standing, and locomotion level  PARTICIPATION LIMITATIONS: meal prep, cleaning, laundry, driving, community activity, yard work, and school  PERSONAL FACTORS: Age, Behavior pattern, Education, Fitness, and Time since onset of injury/illness/exacerbation are also affecting patient's functional outcome.   REHAB POTENTIAL: Good  CLINICAL DECISION MAKING: Stable/uncomplicated  EVALUATION COMPLEXITY: Moderate  PLAN:  PT FREQUENCY: 1-2x/week  PT DURATION: 12 weeks  PLANNED INTERVENTIONS: Therapeutic exercises, Therapeutic activity, Neuromuscular re-education, Balance training, Gait training, Patient/Family education, Self Care, Joint mobilization, Stair training, DME instructions, Cognitive remediation, Cryotherapy, Moist heat, Manual therapy, and Re-evaluation  PLAN FOR NEXT SESSION:   Large amplitude movement.  Endurance training.  Standing PWR!   Maylon Peppers PT, DPT  Physical Therapist - [facility]  10:17 AM 07/01/23

## 2023-07-06 ENCOUNTER — Ambulatory Visit: Payer: Medicare Other | Admitting: Physical Therapy

## 2023-07-06 DIAGNOSIS — R2689 Other abnormalities of gait and mobility: Secondary | ICD-10-CM

## 2023-07-06 DIAGNOSIS — M6281 Muscle weakness (generalized): Secondary | ICD-10-CM | POA: Diagnosis not present

## 2023-07-06 DIAGNOSIS — R278 Other lack of coordination: Secondary | ICD-10-CM

## 2023-07-06 NOTE — Therapy (Signed)
OUTPATIENT PHYSICAL THERAPY NEURO TREATMENT/  Discharge Summary    Patient Name: Carlos Morrison MRN: [id number] DOB:Apr 27, 1941, 82 y.o., male Today's Date: 07/06/2023   PCP: Barbette Reichmann, MD  REFERRING PROVIDER: Barbette Reichmann, MD   END OF SESSION:  PT End of Session - 07/06/23 1154     Visit Number 20    Number of Visits 24    Date for PT Re-Evaluation 07/07/23    Progress Note Due on Visit 20    PT Start Time 1150    PT Stop Time 1230    PT Time Calculation (min) 40 min    Equipment Utilized During Treatment Gait belt    Activity Tolerance Patient tolerated treatment well;No increased pain    Behavior During Therapy WFL for tasks assessed/performed                 Past Medical History:  Diagnosis Date   Arthritis    Bilateral knees   GERD (gastroesophageal reflux disease)    Hyperlipidemia    Past Surgical History:  Procedure Laterality Date   CATARACT EXTRACTION W/PHACO Left 03/30/2018   Procedure: CATARACT EXTRACTION PHACO AND INTRAOCULAR LENS PLACEMENT (IOC)  LEFT;  Surgeon: Lockie Mola, MD;  Location: [facility];  Service: Ophthalmology;  Laterality: Left;   CATARACT EXTRACTION W/PHACO Right 01/29/2021   Procedure: CATARACT EXTRACTION PHACO AND INTRAOCULAR LENS PLACEMENT (IOC) RIGHT;  Surgeon: Lockie Mola, MD;  Location: [facility];  Service: Ophthalmology;  Laterality: Right;  3.24 1:03.0 5.18%   COLONOSCOPY  2011   Dr. Kizzie Ide   COLONOSCOPY N/A 05/01/2015   Procedure: COLONOSCOPY;  Surgeon: Earline Mayotte, MD;  Location: [facility];  Service: Endoscopy;  Laterality: N/A;   GANGLION CYST EXCISION Left 08/2006   Patient Active Problem List   Diagnosis Date Noted   CN (constipation) 03/19/2015    ONSET DATE: fall 2 weeks ago.    REFERRING DIAG:  R26.89 (ICD-10-CM) - Other abnormalities of gait and mobility  Z91.81 (ICD-10-CM) - History of falling    THERAPY DIAG:  Muscle weakness  (generalized)  Other abnormalities of gait and mobility  Other lack of coordination  Rationale for Evaluation and Treatment: Rehabilitation  SUBJECTIVE:                                                                                                                                                                                             SUBJECTIVE STATEMENT:   No new updates since last visit. Mild L knee pain, but not limiting session. Reports that  he feel slike he is walking better. Wife also reports that he is  doing HEP 2x per day with increased speed and success.    Pt accompanied by: significant other   PERTINENT HISTORY: recent fall.   PAIN:  Are you having pain? No  PRECAUTIONS: None  WEIGHT BEARING RESTRICTIONS: No  FALLS: Has patient fallen in last 6 months? Yes. Number of falls 1  LIVING ENVIRONMENT: Lives with: lives with their spouse Lives in: House/apartment Stairs: Yes: External: 4 steps; on left going up Has following equipment at home: None  PLOF: Independent and Independent with basic ADLs  PATIENT GOALS: improve balance. Reduce fall risk. Per Wife: improve Arm stretch    OBJECTIVE:   DIAGNOSTIC FINDINGS: EXAM: MRI HEAD WITHOUT CONTRAST  IMPRESSION: No evidence of acute intracranial abnormality.   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   Mild generalized cerebral and cerebellar atrophy.   Mild paranasal sinus disease, as described.   Trace fluid within the bilateral mastoid air cells.    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   EDEMA:  WFL  MUSCLE TONE: WFL   MUSCLE LENGTH: WFL  DTRs:  Patella 2+ = Normal  POSTURE: rounded shoulders, forward head, and flexed trunk     BED MOBILITY:  Sit to supine Complete  Independence Supine to sit Complete Independence  TRANSFERS: Assistive device utilized: None  Sit to stand: Complete Independence Stand to sit: Complete Independence Chair to chair: Complete Independence Floor:  to be assessed   RAMP:  Level of Assistance: CGA Assistive device utilized:  none   CURB:  Level of Assistance: CGA Assistive device utilized: None   STAIRS: Level of Assistance: SBA Stair Negotiation Technique: Step to Pattern with Bilateral Rails Number of Stairs: 4  Height of Stairs: 6  Comments:  0rail with step to ascent, 2 rails with step through descent   GAIT: Gait pattern: step through pattern, decreased arm swing- Right, decreased arm swing- Left, Left foot flat, shuffling, and narrow BOS Distance walked: 1503 in 6 min walk  Assistive device utilized: None Level of assistance: Complete Independence Comments: increased shuffle/festination as well as tremor in LUE/LLE with fatigue   FUNCTIONAL TESTS:  See below.   PATIENT SURVEYS:  FOTO 55  TODAY'S TREATMENT: DATE: 07/06/2023    PT instructed pt in Goal assessment to measure progress towards LTG.  6 Min Walk Test:  Instructed patient to ambulate as quickly and as safely as possible for 6 minutes using LRAD. Patient was allowed to take standing rest breaks without stopping the test, but if the patient required a sitting rest break the clock would be stopped and the test would be over.  Results: 1503 feet (458 meters) using no AD without assistance . Results indicate that the patient has reduced endurance with ambulation compared to age matched norms.  Age Matched Norms: 35-69 yo M: 19 F: 11, 45-79 yo M: 47 F: 471, 84-89 yo M: 417 F: 392 MDC: 58.21 meters (190.98 feet) or 50 meters (ANPTA Core Set of Outcome Measures for Adults with Neurologic Conditions, 2018)  Patient demonstrates increased fall risk as noted by score of   53/56 on Berg Balance Scale.  (<36= high risk for falls, close to 100%;  37-45 significant >80%; 46-51 moderate >50%; 52-55 lower >25%)  PT instructed pt in DGI. See below for results. Demonstrates increased fall  risk with score of 23/24. (<19 indicates increased fall risk)    PATIENT EDUCATION: Education details: interpretation of outcome measures. D/c recommendations for use of community PD program ie.[employer] Visual merchandiser educated: Patient and Spouse Education method: Medical illustrator Education comprehension: verbalized understanding  HOME EXERCISE PROGRAM: Access Code: [email or web address] URL: [name] Date: 06/03/2023 Prepared by: Grier Rocher  Exercises - Seated Reaching to Side and Across Body  - 1 x daily - 7 x weekly - 3 sets - 10 reps - Seated Reach Forward, Up, and To Sides  - 1 x daily - 7 x weekly - 3 sets - 10 reps - Standing Reach to Opposite Side with Weight Shift  - 1 x daily - 7 x weekly - 3 sets - 10 reps - Step Forward with Arms Reaching to Sides  - 1 x daily - 7 x weekly - 3 sets - 10 reps - Sit to Stand with Arm Reach and Jump  - 1 x daily - 7 x weekly - 3 sets - 10 reps  Access Code: [email or web address] URL: [email or web address] Date: 04/20/2023 Prepared by: Grier Rocher  Exercises - Standing Hip Extension with Resistance at Ankles and Counter Support  - 1 x daily - 5 x weekly - 3 sets - 10 reps - Standing Hip Abduction with Resistance at Ankles and Counter Support  - 1 x daily - 5 x weekly - 3 sets - 10 reps - Marching with Resistance  - 1 x daily - 5 x weekly - 3 sets - 10 reps   SHORT TERM GOALS: Target date: 07/07/2023    Patient will be independent in home exercise program to improve strength/mobility for better functional independence with ADLs. Baseline: given on 7/9 Goal status: IN PROGRESS   LONG TERM GOALS: Target date: 07/07/2023   Patient will increase FOTO score to equal to or greater than  59  to demonstrate statistically significant improvement in mobility and  quality of life.  Baseline: 55 8/13: 48  9/24: 57 Goal status: IN PROGRESS   2.  Patient (> 56 years old) will complete five times sit to stand test in < 15 seconds indicating an increased LE strength and improved balance. Baseline: 16.47 sec  8/13 15.07sec  9/24: 12.04 Goal status: MET  3.  Patient will increase Berg Balance score by > 6 points to demonstrate decreased fall risk during functional activities Baseline: 47/56 8/13:50 9/24: 53 Goal status: MET  4.  Patient will increase 10 meter walk test to >1.22m/s as to improve gait speed for better community ambulation and to reduce fall risk. Baseline: 0.7m/s  8/13: normal 0.7m/s. Fast: 1.38m/s  9/24: Normal: 1.37m/s Fast: 1.66m/s.  Goal status: MET  5.  Patient will reduce timed up and go to <11 seconds to reduce fall risk and demonstrate improved transfer/gait ability. Baseline: 13.37 8/13: 11.42 sec  9/24: 9.07sec  Goal status: MET  6.  Patient will increase dynamic gait index score to >19/24 as to demonstrate reduced fall risk and improved dynamic gait balance for better safety with community/home ambulation.   Baseline: 19/24 8/13: 21/24 9/24 23/24   Goal status: MET  ASSESSMENT:  CLINICAL IMPRESSION:   Patient arrives to treatment session motivated to participate. PT instructed pt in goal assessment to measure progress. Pt demonstrating significant improvement in long term goals meeting 5 of 6 LTG and 1 of 1 STG. Pt demonstrating DGI, Berg, 5xSTS, and 10 mWT all beyond fall risk as well as 6  min walk test of >1548ft, surpassing aged matched norms for >80yo. Due  to improved balance, strength, coordination, pt will no longer require PT services at this time and will plan to continue to manage PD deficits with HEP and community resources.    OBJECTIVE IMPAIRMENTS: Abnormal gait, cardiopulmonary status limiting activity, decreased activity tolerance, decreased balance, decreased cognition, decreased coordination,  decreased endurance, decreased knowledge of condition, decreased mobility, difficulty walking, decreased ROM, decreased strength, decreased safety awareness, impaired flexibility, impaired UE functional use, and improper body mechanics.   ACTIVITY LIMITATIONS: carrying, standing, and locomotion level  PARTICIPATION LIMITATIONS: meal prep, cleaning, laundry, driving, community activity, yard work, and school  PERSONAL FACTORS: Age, Behavior pattern, Education, Fitness, and Time since onset of injury/illness/exacerbation are also affecting patient's functional outcome.   REHAB POTENTIAL: Good  CLINICAL DECISION MAKING: Stable/uncomplicated  EVALUATION COMPLEXITY: Moderate  PLAN:  PT FREQUENCY: 1-2x/week  PT DURATION: 12 weeks  PLANNED INTERVENTIONS: Therapeutic exercises, Therapeutic activity, Neuromuscular re-education, Balance training, Gait training, Patient/Family education, Self Care, Joint mobilization, Stair training, DME instructions, Cognitive remediation, Cryotherapy, Moist heat, Manual therapy, and Re-evaluation  PLAN FOR NEXT SESSION:   D/c   Grier Rocher PT, DPT  Physical Therapist - [place]  [facility]  11:56 AM 07/06/23

## 2023-07-08 ENCOUNTER — Ambulatory Visit: Payer: Medicare Other | Admitting: Physical Therapy

## 2023-07-13 ENCOUNTER — Ambulatory Visit: Payer: Medicare Other | Admitting: Physical Therapy

## 2023-07-15 ENCOUNTER — Ambulatory Visit: Payer: Medicare Other | Admitting: Physical Therapy

## 2023-07-20 ENCOUNTER — Ambulatory Visit: Payer: Medicare Other | Admitting: Physical Therapy

## 2023-07-22 ENCOUNTER — Ambulatory Visit: Payer: Medicare Other | Admitting: Physical Therapy

## 2023-07-27 ENCOUNTER — Ambulatory Visit: Payer: Medicare Other | Admitting: Physical Therapy

## 2023-07-29 ENCOUNTER — Ambulatory Visit: Payer: Medicare Other | Admitting: Physical Therapy

## 2023-08-03 ENCOUNTER — Ambulatory Visit: Payer: Federal, State, Local not specified - PPO | Admitting: Physical Therapy

## 2023-08-05 ENCOUNTER — Ambulatory Visit: Payer: Federal, State, Local not specified - PPO | Admitting: Physical Therapy

## 2023-08-10 ENCOUNTER — Ambulatory Visit: Payer: Federal, State, Local not specified - PPO | Admitting: Physical Therapy

## 2023-08-12 ENCOUNTER — Ambulatory Visit: Payer: Federal, State, Local not specified - PPO | Admitting: Physical Therapy

## 2023-08-17 ENCOUNTER — Ambulatory Visit: Payer: Federal, State, Local not specified - PPO | Admitting: Physical Therapy

## 2023-08-19 ENCOUNTER — Ambulatory Visit: Payer: Federal, State, Local not specified - PPO | Admitting: Physical Therapy

## 2023-08-24 ENCOUNTER — Ambulatory Visit: Payer: Federal, State, Local not specified - PPO | Admitting: Physical Therapy

## 2023-08-26 ENCOUNTER — Ambulatory Visit: Payer: Federal, State, Local not specified - PPO | Admitting: Physical Therapy

## 2023-08-31 ENCOUNTER — Ambulatory Visit: Payer: Federal, State, Local not specified - PPO | Admitting: Physical Therapy

## 2023-09-02 ENCOUNTER — Ambulatory Visit: Payer: Federal, State, Local not specified - PPO | Admitting: Physical Therapy

## 2023-09-07 ENCOUNTER — Ambulatory Visit: Payer: Federal, State, Local not specified - PPO | Admitting: Physical Therapy

## 2023-09-14 ENCOUNTER — Ambulatory Visit: Payer: Federal, State, Local not specified - PPO | Admitting: Physical Therapy

## 2023-09-16 ENCOUNTER — Ambulatory Visit: Payer: Federal, State, Local not specified - PPO | Admitting: Physical Therapy

## 2023-09-21 ENCOUNTER — Ambulatory Visit: Payer: Federal, State, Local not specified - PPO | Admitting: Physical Therapy

## 2023-09-23 ENCOUNTER — Ambulatory Visit: Payer: Federal, State, Local not specified - PPO | Admitting: Physical Therapy

## 2023-09-28 ENCOUNTER — Ambulatory Visit: Payer: Federal, State, Local not specified - PPO | Admitting: Physical Therapy

## 2023-09-30 ENCOUNTER — Ambulatory Visit: Payer: Federal, State, Local not specified - PPO | Admitting: Physical Therapy

## 2023-10-05 ENCOUNTER — Ambulatory Visit: Payer: Federal, State, Local not specified - PPO | Admitting: Physical Therapy

## 2023-10-07 ENCOUNTER — Ambulatory Visit: Payer: Federal, State, Local not specified - PPO | Admitting: Physical Therapy
